# Patient Record
Sex: Male | Born: 1990 | Race: Black or African American | Hispanic: No | Marital: Single | Smoking: Never smoker
Health system: Southern US, Community
[De-identification: ages and names within clinical notes are randomized; demographics above are authoritative.]

---
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
## 2010-07-14 LAB — CULTURE, ROUTINE-ABSCESS

## 2013-06-09 ENCOUNTER — Emergency Department (HOSPITAL_COMMUNITY)
Admission: EM | Admit: 2013-06-09 | Discharge: 2013-06-09 | Disposition: A | Discharge status: Home / Self Care | Payer: PRIVATE HEALTH INSURANCE | Attending: Emergency Medicine | Admitting: Emergency Medicine

## 2013-06-09 ENCOUNTER — Encounter (HOSPITAL_COMMUNITY): Payer: Self-pay | Admitting: Emergency Medicine

## 2013-06-09 DIAGNOSIS — F17200 Nicotine dependence, unspecified, uncomplicated: Secondary | ICD-10-CM | POA: Insufficient documentation

## 2013-06-09 DIAGNOSIS — K0889 Other specified disorders of teeth and supporting structures: Secondary | ICD-10-CM

## 2013-06-09 DIAGNOSIS — K089 Disorder of teeth and supporting structures, unspecified: Secondary | ICD-10-CM | POA: Insufficient documentation

## 2013-06-09 DIAGNOSIS — K029 Dental caries, unspecified: Secondary | ICD-10-CM | POA: Insufficient documentation

## 2013-06-09 DIAGNOSIS — K002 Abnormalities of size and form of teeth: Secondary | ICD-10-CM | POA: Insufficient documentation

## 2013-06-09 MED ORDER — HYDROCODONE-ACETAMINOPHEN 5-325 MG PO TABS: ORAL_TABLET | ORAL | Status: DC

## 2013-06-09 MED ORDER — NAPROXEN 500 MG PO TABS: 500.0000 mg | ORAL_TABLET | Freq: Two times a day (BID) | ORAL | Status: DC

## 2013-06-09 MED ORDER — HYDROCODONE-ACETAMINOPHEN 5-325 MG PO TABS
1.0000 | ORAL_TABLET | Freq: Once | ORAL | Status: AC
  Administered 2013-06-09: 1 via ORAL
  Filled 2013-06-09: qty 1

## 2013-06-09 NOTE — Discharge Instructions (Signed)
Please read and follow all provided instructions.  Your diagnoses today include:  1. Pain, dental     The exam and treatment you received today has been provided on an emergency basis only. This is not a substitute for complete medical or dental care.  Tests performed today include:  Vital signs. See below for your results today.   Medications prescribed:   Vicodin (hydrocodone/acetaminophen) - narcotic pain medication  DO NOT drive or perform any activities that require you to be awake and alert because this medicine can make you drowsy. BE VERY CAREFUL not to take multiple medicines containing Tylenol (also called acetaminophen). Doing so can lead to an overdose which can damage your liver and cause liver failure and possibly death.   Naproxen - anti-inflammatory pain medication  Do not exceed 500mg  naproxen every 12 hours, take with food  You have been prescribed an anti-inflammatory medication or NSAID. Take with food. Take smallest effective dose for the shortest duration needed for your pain. Stop taking if you experience stomach pain or vomiting.   Take any prescribed medications only as directed.  Home care instructions:  Follow any educational materials contained in this packet.  Follow-up instructions: Please follow-up with your dentist for further evaluation of your symptoms. If you do not have a dentist or primary care doctor -- see below for referral information.   Dental Assistance: See below for dental referrals  Return instructions:   Please return to the Emergency Department if you experience worsening symptoms.  Please return if you develop a fever, you develop more swelling in your face or neck, you have trouble breathing or swallowing food.  Please return if you have any other emergent concerns.  Additional Information:  Your vital signs today were: BP 152/80   Pulse 94   Temp(Src) 97.8 F (36.6 C) (Oral)   Resp 18   SpO2 100% If your blood pressure  (BP) was elevated above 135/85 this visit, please have this repeated by your doctor within one month. -------------- Dental Care: Organization         Address  Phone  Notes  [facility] [address], [place] [phone] Accepts children up to age 23 who are enrolled in [place] or [place] Health Choice; pregnant women with a Medicaid card; and children who have applied for Medicaid or [place] Health Choice, but were declined, whose parents can pay a reduced fee at time of service.  [facility] [place]  [address], [place] [phone] Accepts children up to age 23 who are enrolled in [place] or [place] Health Choice; pregnant women with a Medicaid card; and children who have applied for Medicaid or [place] Health Choice, but were declined, whose parents can pay a reduced fee at time of service.  [facility]  [address], [place] [phone] Patients are seen by appointment only. Walk-ins are not accepted. [facility] will see patients 23 years of age and older. Monday - Tuesday (8am-5pm) Most Wednesdays (8:30-5pm) $30 per visit, cash only  [facility]  [address], [facility] [phone] Patients are seen by appointment only. Walk-ins are not accepted. [facility] will see patients 23 years of age and older. One Wednesday Evening (Monthly: Volunteer Based).  $30 per visit, cash only  [facility]  [phone] for adults; Children under age 544, call  Graduate Pediatric Dentistry at [phone]. Children aged 19-14, please call [phone] to request a pediatric application.  Dental services are provided in all areas of dental care including fillings, crowns and bridges, complete and partial dentures, implants, gum treatment, root canals, and extractions. Preventive care is also provided. Treatment  is provided to both adults and children. Patients are selected via a lottery and there is often a waiting list.   [facility] [address], [place]  [phone] [email or web address]   [facility] [address] [place], [place] [phone], Ext. [phone] Second and Fourth Thursday of each month, opens at 6:30 AM; Clinic ends at 9 AM.  Patients are seen on a first-come first-served basis, and a limited number are seen during each clinic.   [facility]  [address] [place], [place] [phone]   Eligibility Requirements You must have lived in [place], [place], or [place] counties for at least the last three months.   You cannot be eligible for state or federal sponsored [place], including [employer], [place], or [employer].   You generally cannot be eligible for healthcare insurance through your employer.    How to apply: Eligibility screenings are held every Tuesday and Wednesday afternoon from 1:00 pm until 4:00 pm. You do not need an appointment for the interview!  [facility] [address], [place], [place] [phone]   [facility]  [phone]   [facility] Department  [phone]   [facility]  [phone]

## 2013-06-09 NOTE — ED Provider Notes (Signed)
CSN: [id number]     Arrival date & time 06/09/13  [id number] History  This chart was scribed for non-physician practitioner, Renne CriglerJoshua Izora Benn, PA-C, working with Shelda JakesScott W. Zackowski, MD by Shari HeritageAisha Amuda, ED Scribe. This patient was seen in room TR05C/TR05C and the patient's care was started at 10:04 AM.    Chief Complaint  Patient presents with  . Dental Pain    The history is provided by the patient. No language interpreter was used.    HPI Comments: Jose FlamingKarlo D Mendez is a 23 y.o. male who presents to the Emergency Department complaining of severe constant left lower dental pain onset 8 hours ago. Patient states that pain woke him from sleep. He has seen a dentist for this problem and states that he is supposed to have a root canal.  He has taken Tramadol without relief. He denies facial swelling, dysphagia, fever, nausea or vomiting. Patient has no chronic medical conditions. He is a current every day smoker.  History reviewed. No pertinent past medical history. History reviewed. No pertinent past surgical history. History reviewed. No pertinent family history. History  Substance Use Topics  . Smoking status: Current Every Day Smoker  . Smokeless tobacco: Not on file  . Alcohol Use: No    Review of Systems  Constitutional: Negative for fever.  HENT: Positive for dental problem. Negative for ear pain, facial swelling, sore throat and trouble swallowing.   Respiratory: Negative for shortness of breath and stridor.   Gastrointestinal: Negative for nausea and vomiting.  Musculoskeletal: Negative for neck pain.  Skin: Negative for color change.  Neurological: Negative for headaches.      Allergies  Review of patient's allergies indicates no known allergies.  Home Medications  No current outpatient prescriptions on file. Triage Vitals: BP 152/80  Pulse 94  Temp(Src) 97.8 F (36.6 C) (Oral)  Resp 18  SpO2 100% Physical Exam  Nursing note and vitals reviewed. Constitutional: He appears  well-developed and well-nourished. No distress.  HENT:  Head: Normocephalic and atraumatic.  Right Ear: Tympanic membrane, external ear and ear canal normal.  Left Ear: Tympanic membrane, external ear and ear canal normal.  Nose: Nose normal.  Mouth/Throat: Uvula is midline, oropharynx is clear and moist and mucous membranes are normal. No trismus in the jaw. Abnormal dentition. Dental caries present. No dental abscesses or uvula swelling. No tonsillar abscesses.  Tenderness to percussion of 1st left lower molar. Scattered caries. Gingiva normal.  No facial swelling appreciated. No gumline swelling.  Eyes: EOM are normal. Pupils are equal, round, and reactive to light.  Neck: Normal range of motion. Neck supple. No tracheal deviation present.  No neck swelling or Lugwig's angina  Cardiovascular: Normal rate.   Pulmonary/Chest: Effort normal. No respiratory distress.  Musculoskeletal: Normal range of motion.  Neurological: He is alert.  Skin: Skin is warm and dry.  Psychiatric: He has a normal mood and affect. His behavior is normal.    ED Course  Procedures (including critical care time) DIAGNOSTIC STUDIES: Oxygen Saturation is 100% on room air, normal by my interpretation.    COORDINATION OF CARE: 10:10 AM- Patient informed of current plan for treatment and evaluation and agrees with plan at this time.   Patient seen and examined. Medications ordered.   Vital signs reviewed and are as follows: Filed Vitals:   06/09/13 0958  BP: 152/80  Pulse: 94  Temp: 97.8 F (36.6 C)  Resp: 18   Patient counseled to take prescribed medications as directed, return with worsening  facial or neck swelling, and to follow-up with their dentist as soon as possible.   Patient counseled on use of narcotic pain medications. Counseled not to combine these medications with others containing tylenol. Urged not to drink alcohol, drive, or perform any other activities that requires focus while taking  these medications. The patient verbalizes understanding and agrees with the plan.    MDM   Final diagnoses:  Pain, dental   Patient with toothache. No fever. Exam unconcerning for Ludwig's angina or other deep tissue infection in neck.   As there is no facial swelling or gum findings, will not prescribe antibiotics at this time. Will treat with pain medication.     I personally performed the services described in this documentation, which was scribed in my presence. The recorded information has been reviewed and is accurate.    [name], PA-C 06/09/13 1030

## 2013-06-09 NOTE — ED Notes (Signed)
Pt c/o left lower dental pain x several weeks that is worse x 2 days

## 2013-06-13 NOTE — ED Provider Notes (Signed)
Medical screening examination/treatment/procedure(s) were performed by non-physician practitioner and as supervising physician I was immediately available for consultation/collaboration.  EKG Interpretation   None         Shelda JakesScott W. Cova Knieriem, MD 06/13/13 [phone]

## 2013-08-07 ENCOUNTER — Emergency Department (HOSPITAL_COMMUNITY)
Admission: EM | Admit: 2013-08-07 | Discharge: 2013-08-07 | Disposition: A | Discharge status: Home / Self Care | Payer: PRIVATE HEALTH INSURANCE | Attending: Emergency Medicine | Admitting: Emergency Medicine

## 2013-08-07 ENCOUNTER — Encounter (HOSPITAL_COMMUNITY): Payer: Self-pay | Admitting: Emergency Medicine

## 2013-08-07 DIAGNOSIS — Y939 Activity, unspecified: Secondary | ICD-10-CM | POA: Insufficient documentation

## 2013-08-07 DIAGNOSIS — Y929 Unspecified place or not applicable: Secondary | ICD-10-CM | POA: Insufficient documentation

## 2013-08-07 DIAGNOSIS — W540XXA Bitten by dog, initial encounter: Secondary | ICD-10-CM | POA: Insufficient documentation

## 2013-08-07 DIAGNOSIS — F17200 Nicotine dependence, unspecified, uncomplicated: Secondary | ICD-10-CM | POA: Insufficient documentation

## 2013-08-07 DIAGNOSIS — L089 Local infection of the skin and subcutaneous tissue, unspecified: Secondary | ICD-10-CM

## 2013-08-07 DIAGNOSIS — Z791 Long term (current) use of non-steroidal anti-inflammatories (NSAID): Secondary | ICD-10-CM | POA: Insufficient documentation

## 2013-08-07 DIAGNOSIS — Z23 Encounter for immunization: Secondary | ICD-10-CM | POA: Insufficient documentation

## 2013-08-07 DIAGNOSIS — T148XXA Other injury of unspecified body region, initial encounter: Secondary | ICD-10-CM

## 2013-08-07 DIAGNOSIS — S61509A Unspecified open wound of unspecified wrist, initial encounter: Secondary | ICD-10-CM | POA: Insufficient documentation

## 2013-08-07 LAB — CBC WITH DIFFERENTIAL/PLATELET
BASOS ABS: 0 10*3/uL (ref 0.0–0.1)
BASOS PCT: 0 % (ref 0–1)
EOS ABS: 0 10*3/uL (ref 0.0–0.7)
EOS PCT: 0 % (ref 0–5)
HCT: 46 % (ref 39.0–52.0)
Hemoglobin: 15.6 g/dL (ref 13.0–17.0)
LYMPHS ABS: 2.8 10*3/uL (ref 0.7–4.0)
Lymphocytes Relative: 22 % (ref 12–46)
MCH: 28.8 pg (ref 26.0–34.0)
MCHC: 33.9 g/dL (ref 30.0–36.0)
MCV: 84.9 fL (ref 78.0–100.0)
Monocytes Absolute: 0.6 10*3/uL (ref 0.1–1.0)
Monocytes Relative: 4 % (ref 3–12)
NEUTROS PCT: 74 % (ref 43–77)
Neutro Abs: 9.3 10*3/uL — ABNORMAL HIGH (ref 1.7–7.7)
PLATELETS: 184 10*3/uL (ref 150–400)
RBC: 5.42 MIL/uL (ref 4.22–5.81)
RDW: 13.3 % (ref 11.5–15.5)
WBC: 12.7 10*3/uL — AB (ref 4.0–10.5)

## 2013-08-07 MED ORDER — AMOXICILLIN-POT CLAVULANATE 875-125 MG PO TABS: 1.0000 | ORAL_TABLET | Freq: Two times a day (BID) | ORAL | Status: DC

## 2013-08-07 MED ORDER — TETANUS-DIPHTH-ACELL PERTUSSIS 5-2.5-18.5 LF-MCG/0.5 IM SUSP
0.5000 mL | Freq: Once | INTRAMUSCULAR | Status: AC
  Administered 2013-08-07: 0.5 mL via INTRAMUSCULAR
  Filled 2013-08-07: qty 0.5

## 2013-08-07 MED ORDER — SODIUM CHLORIDE 0.9 % IV SOLN: 3.0000 g | Freq: Once | INTRAVENOUS | Status: DC

## 2013-08-07 MED ORDER — RABIES IMMUNE GLOBULIN 150 UNIT/ML IM INJ
20.0000 [IU]/kg | INJECTION | Freq: Once | INTRAMUSCULAR | Status: AC
  Administered 2013-08-07: 1725 [IU] via INTRAMUSCULAR
  Filled 2013-08-07: qty 11.5

## 2013-08-07 MED ORDER — ONDANSETRON 4 MG PO TBDP
8.0000 mg | ORAL_TABLET | Freq: Once | ORAL | Status: AC
  Administered 2013-08-07: 8 mg via ORAL
  Filled 2013-08-07: qty 2

## 2013-08-07 MED ORDER — OXYCODONE-ACETAMINOPHEN 5-325 MG PO TABS
1.0000 | ORAL_TABLET | Freq: Once | ORAL | Status: AC
  Administered 2013-08-07: 1 via ORAL
  Filled 2013-08-07: qty 1

## 2013-08-07 MED ORDER — AMOXICILLIN-POT CLAVULANATE 875-125 MG PO TABS
1.0000 | ORAL_TABLET | Freq: Once | ORAL | Status: AC
  Administered 2013-08-07: 1 via ORAL
  Filled 2013-08-07: qty 1

## 2013-08-07 MED ORDER — RABIES VACCINE, PCEC IM SUSR
1.0000 mL | Freq: Once | INTRAMUSCULAR | Status: AC
Start: 2013-08-07 — End: 2013-08-07
  Administered 2013-08-07: 1 mL via INTRAMUSCULAR
  Filled 2013-08-07: qty 1

## 2013-08-07 NOTE — Consult Note (Signed)
Reason for Consult:right wrist volar dogbite Referring Physician: Bynum BellowsAllen  Jose Mendez is an 23 y.o. male.  HPI: s/p dogbite 48 hours ago  History reviewed. No pertinent past medical history.  History reviewed. No pertinent past surgical history.  No family history on file.  Social History:  reports that he has been smoking.  He does not have any smokeless tobacco history on file. He reports that he does not drink alcohol or use illicit drugs.  Allergies: No Known Allergies  Medications:  Scheduled: . rabies immune globulin  20 Units/kg Intramuscular Once  . rabies vaccine  1 mL Intramuscular Once  . Tdap  0.5 mL Intramuscular Once    Results for orders placed during the hospital encounter of 08/07/13 (from the past 48 hour(s))  CBC WITH DIFFERENTIAL     Status: Abnormal   Collection Time    08/07/13  1:04 PM      Result Value Ref Range   WBC 12.7 (*) 4.0 - 10.5 K/uL   RBC 5.42  4.22 - 5.81 MIL/uL   Hemoglobin 15.6  13.0 - 17.0 g/dL   HCT 16.146.0  09.639.0 - 04.552.0 %   MCV 84.9  78.0 - 100.0 fL   MCH 28.8  26.0 - 34.0 pg   MCHC 33.9  30.0 - 36.0 g/dL   RDW 40.913.3  81.111.5 - 91.415.5 %   Platelets 184  150 - 400 K/uL   Neutrophils Relative % 74  43 - 77 %   Neutro Abs 9.3 (*) 1.7 - 7.7 K/uL   Lymphocytes Relative 22  12 - 46 %   Lymphs Abs 2.8  0.7 - 4.0 K/uL   Monocytes Relative 4  3 - 12 %   Monocytes Absolute 0.6  0.1 - 1.0 K/uL   Eosinophils Relative 0  0 - 5 %   Eosinophils Absolute 0.0  0.0 - 0.7 K/uL   Basophils Relative 0  0 - 1 %   Basophils Absolute 0.0  0.0 - 0.1 K/uL    No results found.  Review of Systems  All other systems reviewed and are negative.  Blood pressure 101/36, pulse 70, temperature 98.3 F (36.8 C), temperature source Oral, resp. rate 18, weight 87.544 kg (193 lb), SpO2 97.00%. Physical Exam  Constitutional: He is oriented to person, place, and time. He appears well-developed and well-nourished.  HENT:  Head: Normocephalic and atraumatic.   Cardiovascular: Normal rate.   Respiratory: Effort normal.  Musculoskeletal:       Right wrist: He exhibits tenderness and swelling.  Small midline volar abscess with drainage  kanavels negative  No sign of ascending infection  Neurological: He is alert and oriented to person, place, and time.  Skin: Skin is warm.  Psychiatric: He has a normal mood and affect. His behavior is normal. Judgment and thought content normal.    Assessment/Plan: As above  Patient has spontaneous drainage here in ER  Would start PO augmentin 875 BID and warm soapy soaks TID for 20 minutes  Will see in my office tommorrrow  Marlowe ShoresMatthew A Anjolaoluwa Siguenza 08/07/2013, 2:50 PM

## 2013-08-07 NOTE — ED Notes (Signed)
Pt bit by a unknown pit bull to right wrist. Pt presents with abscess to anterior right wrist and scratch to posterior right wrist. Last Tetanus unknown.

## 2013-08-07 NOTE — ED Notes (Signed)
Call to triage no response

## 2013-08-07 NOTE — ED Provider Notes (Signed)
CSN: [id number]     Arrival date & time 08/07/13  1037 History   HPI Comments: Jose Mendez is a 23 y.o. male who presents to the Emergency Department with a chief complaint of an animal bite to the R wrist. 2 days ago.  He reports it was a neighborhood dog unknown vaccination history. The patient reports increased pain with flexion of the wrist. He is unsure about his last Td vaccination was. He reports increase pain with flexion and extension of the wrist.  The history is provided by the patient. No language interpreter was used.    History reviewed. No pertinent past medical history. History reviewed. No pertinent past surgical history. No family history on file. History  Substance Use Topics  . Smoking status: Current Every Day Smoker  . Smokeless tobacco: Not on file  . Alcohol Use: No    Review of Systems  Constitutional: Negative for fever, chills and fatigue.  Respiratory: Negative for shortness of breath.   Gastrointestinal: Negative for nausea and vomiting.  Musculoskeletal: Positive for arthralgias and joint swelling.  Skin: Positive for color change and wound.  Neurological: Negative for weakness and numbness.  All other systems reviewed and are negative.  Allergies  Review of patient's allergies indicates no known allergies.  Home Medications   Prior to Admission medications   Medication Sig Start Date End Date Taking? Authorizing Provider  amoxicillin (AMOXIL) 500 MG capsule Take 500 mg by mouth 4 (four) times daily. Started on 2/2 for unknown length    Historical Provider, MD  HYDROcodone-acetaminophen (NORCO/VICODIN) 5-325 MG per tablet Take 1-2 tablets every 6 hours as needed for severe pain 06/09/13   Renne CriglerJoshua Geiple, PA-C  naproxen (NAPROSYN) 500 MG tablet Take 1 tablet (500 mg total) by mouth 2 (two) times daily. 06/09/13   Renne CriglerJoshua Geiple, PA-C  traMADol (ULTRAM) 50 MG tablet Take 50 mg by mouth every 6 (six) hours as needed for moderate pain.    Historical  Provider, MD   Triage Vitals: BP 101/36  Pulse 70  Temp(Src) 98.3 F (36.8 C) (Oral)  Resp 18  Wt 193 lb (87.544 kg)  SpO2 97%  Physical Exam  Nursing note and vitals reviewed. Constitutional: He is oriented to person, place, and time. He appears well-developed and well-nourished. No distress.  HENT:  Head: Normocephalic and atraumatic.  Eyes: EOM are normal.  Neck: Neck supple. No tracheal deviation present.  Cardiovascular: Normal rate.   Pulmonary/Chest: Effort normal. No respiratory distress.  Musculoskeletal: Normal range of motion.  0.5 x 0.5 cm pustular wound to the volar aspect of the right forearm.  Surrounding erythema.  Discomfort with flexion and extension of the wrist.  Superficial abrasion to medial forearm, no drainage or surrounding erythema.   Neurological: He is alert and oriented to person, place, and time.  Skin: Skin is warm and dry. There is erythema.  Psychiatric: He has a normal mood and affect. His behavior is normal.    ED Course  Procedures (including critical care time)  COORDINATION OF CARE: 2:22 PM-Consulted with Dr. Ronie SpiesWeingold's PA who will evaluate patient in the ED. Treatment plan discussed with patient and patient agrees.   Labs Review Labs Reviewed  CBC WITH DIFFERENTIAL - Abnormal; Notable for the following:    WBC 12.7 (*)    Neutro Abs 9.3 (*)    All other components within normal limits  WOUND CULTURE   Imaging Review No results found.   EKG Interpretation None  MDM   Final diagnoses:  Wound infection  Dog bite   Pt bit by dog 2 days ago, pustular puncture wound to wrist.  Discussed with Dr. Freida BusmanAllen advises possible early tendosynovitis.  While in the ED the pt opened the wound and drained it, himself.  Wound culutre, cbc ordered. Rabies shots Td given. Discussed with Dr. Mina MarbleWeingold who evaluated in the ED.    Dr. Mina MarbleWeingold evaluated the patient in the emergency department.  He advises warm soaks with soap. Unasyn 3 g.  Antibiotics with followup as an outpatient. Patient refused IV antibiotics, discussed the importance of treating infection. Reports he will follow up with Dr. Mina MarbleWeingold as outpatient and take his oral antibiotics. Discussed treatment plan with the patient, return for his next round of rabies shots. Return precautions given. Reports understanding and no other concerns at this time.  Patient is stable for discharge at this time.  Meds given in ED:  Medications  oxyCODONE-acetaminophen (PERCOCET/ROXICET) 5-325 MG per tablet 1 tablet (1 tablet Oral Given 08/07/13 1111)  ondansetron (ZOFRAN-ODT) disintegrating tablet 8 mg (8 mg Oral Given 08/07/13 1113)  Tdap (BOOSTRIX) injection 0.5 mL (0.5 mLs Intramuscular Given 08/07/13 1538)  rabies vaccine (RABAVERT) injection 1 mL (1 mL Intramuscular Given 08/07/13 1531)  rabies immune globulin (HYPERAB) injection 1,725 Units (1,725 Units Intramuscular Given 08/07/13 1530)  amoxicillin-clavulanate (AUGMENTIN) 875-125 MG per tablet 1 tablet (1 tablet Oral Given 08/07/13 1544)    Discharge Medication List as of 08/07/2013  3:44 PM    START taking these medications   Details  amoxicillin-clavulanate (AUGMENTIN) 875-125 MG per tablet Take 1 tablet by mouth 2 (two) times daily., Starting 08/07/2013, Until Discontinued, Print         [name], PA-C 08/09/13 1744

## 2013-08-07 NOTE — Discharge Instructions (Signed)
Call Dr. Mina MarbleWeingold for a follow up evaluation. Return to the Emergency Department in 3 days for more Rabies Vaccination shots. Call for a follow up appointment with a Family or Primary Care Provider.  Return if Symptoms worsen.   Take medication as prescribed.  Soak in a warm water with soap for 20 minutes as instructed by Dr. Mina MarbleWeingold.

## 2013-08-09 ENCOUNTER — Telehealth (HOSPITAL_BASED_OUTPATIENT_CLINIC_OR_DEPARTMENT_OTHER): Payer: Self-pay

## 2013-08-09 LAB — WOUND CULTURE: Special Requests: NORMAL

## 2013-08-09 NOTE — Telephone Encounter (Signed)
[facility].  Rx for Augmentin -> Resistant to the same.  Called Dr Ronie SpiesWeingold's office with whom pt was to have f/u appt and was told pt was a "no show".  Chart reviewed by Mckinley JewelKaitlyn S. PA "Bactrim DS 160/800 Take 1 tab po BID # 20, no RF"  08/09/2013 @ 1347 LVM requesting callback.

## 2013-08-11 NOTE — ED Provider Notes (Signed)
Medical screening examination/treatment/procedure(s) were performed by non-physician practitioner and as supervising physician I was immediately available for consultation/collaboration.   EKG Interpretation None       Maliha Outten T Pamala Hayman, MD 08/11/13 1419 

## 2013-08-16 ENCOUNTER — Telehealth (HOSPITAL_BASED_OUTPATIENT_CLINIC_OR_DEPARTMENT_OTHER): Payer: Self-pay

## 2013-08-16 NOTE — Telephone Encounter (Signed)
LVM requesting callback.  Unable to reach x 4. [facility]

## 2013-10-12 ENCOUNTER — Telehealth (HOSPITAL_BASED_OUTPATIENT_CLINIC_OR_DEPARTMENT_OTHER): Payer: Self-pay | Admitting: Emergency Medicine

## 2014-08-10 ENCOUNTER — Encounter (HOSPITAL_COMMUNITY): Payer: Self-pay | Admitting: Emergency Medicine

## 2014-08-10 ENCOUNTER — Emergency Department (HOSPITAL_COMMUNITY)
Admission: EM | Admit: 2014-08-10 | Discharge: 2014-08-10 | Disposition: A | Discharge status: Home / Self Care | Payer: PRIVATE HEALTH INSURANCE | Attending: Emergency Medicine | Admitting: Emergency Medicine

## 2014-08-10 DIAGNOSIS — Y9289 Other specified places as the place of occurrence of the external cause: Secondary | ICD-10-CM | POA: Insufficient documentation

## 2014-08-10 DIAGNOSIS — Z792 Long term (current) use of antibiotics: Secondary | ICD-10-CM | POA: Insufficient documentation

## 2014-08-10 DIAGNOSIS — Z720 Tobacco use: Secondary | ICD-10-CM | POA: Insufficient documentation

## 2014-08-10 DIAGNOSIS — Y9389 Activity, other specified: Secondary | ICD-10-CM | POA: Insufficient documentation

## 2014-08-10 DIAGNOSIS — S61203A Unspecified open wound of left middle finger without damage to nail, initial encounter: Secondary | ICD-10-CM | POA: Insufficient documentation

## 2014-08-10 DIAGNOSIS — Z791 Long term (current) use of non-steroidal anti-inflammatories (NSAID): Secondary | ICD-10-CM | POA: Insufficient documentation

## 2014-08-10 DIAGNOSIS — W452XXA Lid of can entering through skin, initial encounter: Secondary | ICD-10-CM | POA: Insufficient documentation

## 2014-08-10 DIAGNOSIS — T148XXA Other injury of unspecified body region, initial encounter: Secondary | ICD-10-CM

## 2014-08-10 DIAGNOSIS — Y998 Other external cause status: Secondary | ICD-10-CM | POA: Insufficient documentation

## 2014-08-10 MED ORDER — IBUPROFEN 800 MG PO TABS: 800.0000 mg | ORAL_TABLET | Freq: Three times a day (TID) | ORAL | Status: DC | PRN

## 2014-08-10 NOTE — ED Notes (Signed)
Pt. presents with laceration at left distal middle finger sustained this evening while opening a can , no bleeding / dressing applied prior to arrival .

## 2014-08-10 NOTE — ED Provider Notes (Signed)
CSN: [id number]     Arrival date & time 08/10/14  1945 History  This chart was scribed for non-physician practitioner, Ebbie Ridgehris Levoy Geisen, PA-C,working with Rolland PorterMark James, MD, by Karle PlumberJennifer Tensley, ED Scribe. This patient was seen in room TR03C/TR03C and the patient's care was started at 8:57 PM.  Chief Complaint  Patient presents with  . Finger Injury   The history is provided by the patient and medical records. No language interpreter was used.    HPI Comments:  Jose Mendez is a 24 y.o. male who presents to the Emergency Department complaining of a laceration to the third digit of the left hand that occurred approximately 1.5 hours ago. He states he was opening a tin can of food when the lid sliced into his finger. He reports associated bleeding that is now controlled. He has wrapped the finger to control the bleeding. Denies modifying factors of the pain or bleeding. Denies numbness, tingling or weakness of the digit or left hand, redness or warmth. Pt reports that his tetanus vaccination is UTD.  History reviewed. No pertinent past medical history. History reviewed. No pertinent past surgical history. No family history on file. History  Substance Use Topics  . Smoking status: Current Every Day Smoker  . Smokeless tobacco: Not on file  . Alcohol Use: No    Review of Systems All other systems negative except as documented in the HPI. All pertinent positives and negatives as reviewed in the HPI.  Allergies  Review of patient's allergies indicates no known allergies.  Home Medications   Prior to Admission medications   Medication Sig Start Date End Date Taking? Authorizing Provider  amoxicillin (AMOXIL) 500 MG capsule Take 500 mg by mouth 4 (four) times daily. Started on 2/2 for unknown length    Historical Provider, MD  amoxicillin-clavulanate (AUGMENTIN) 875-125 MG per tablet Take 1 tablet by mouth 2 (two) times daily. 08/07/13   Mellody DrownLauren Parker, PA-C  HYDROcodone-acetaminophen  (NORCO/VICODIN) 5-325 MG per tablet Take 1-2 tablets every 6 hours as needed for severe pain 06/09/13   Renne CriglerJoshua Geiple, PA-C  naproxen (NAPROSYN) 500 MG tablet Take 1 tablet (500 mg total) by mouth 2 (two) times daily. 06/09/13   Renne CriglerJoshua Geiple, PA-C  traMADol (ULTRAM) 50 MG tablet Take 50 mg by mouth every 6 (six) hours as needed for moderate pain.    Historical Provider, MD   Triage Vitals: BP 119/77 mmHg  Pulse 80  Temp(Src) 98.1 F (36.7 C) (Oral)  Resp 20  SpO2 100% Physical Exam  Constitutional: He is oriented to person, place, and time. He appears well-developed and well-nourished.  HENT:  Head: Normocephalic and atraumatic.  Eyes: EOM are normal.  Neck: Normal range of motion.  Cardiovascular: Normal rate.   Pulmonary/Chest: Effort normal.  Musculoskeletal: Normal range of motion.  Good ROM of third left digit.  Neurological: He is alert and oriented to person, place, and time.  Distal sensations intact of third left digit.  Skin: Skin is warm and dry.  Skin avulsion to the lateral third digit of left hand just lateral to the nail.  Psychiatric: He has a normal mood and affect. His behavior is normal.  Nursing note and vitals reviewed.   ED Course  Procedures (including critical care time) DIAGNOSTIC STUDIES: Oxygen Saturation is 100% on RA, normal by my interpretation.   COORDINATION OF CARE: 9:00 PM- Will clean and dress wound. Advised pt that sutures were not necessary for the wound. Pt verbalizes understanding and agrees to plan.  Patient has an avulsion to the skin of the finger where he was cut. There is no area to sutures  I personally performed the services described in this documentation, which was scribed in my presence. The recorded information has been reviewed and is accurate.    Charlestine Night, PA-C 08/10/14 2204  Rolland Porter, MD 08/21/14 [phone]

## 2014-08-10 NOTE — ED Notes (Signed)
Patient is alert and orientedx4.  Patient was explained discharge instructions and they understood them with no questions.   

## 2014-08-10 NOTE — Discharge Instructions (Signed)
Return as needed. Keep the area clean and dry.

## 2019-01-10 ENCOUNTER — Encounter (HOSPITAL_COMMUNITY): Payer: Self-pay | Admitting: Emergency Medicine

## 2019-01-10 ENCOUNTER — Other Ambulatory Visit: Payer: Self-pay

## 2019-01-10 ENCOUNTER — Emergency Department (HOSPITAL_COMMUNITY)
Admission: EM | Admit: 2019-01-10 | Discharge: 2019-01-10 | Disposition: A | Discharge status: Home / Self Care | Payer: PRIVATE HEALTH INSURANCE | Attending: Emergency Medicine | Admitting: Emergency Medicine

## 2019-01-10 DIAGNOSIS — Z79899 Other long term (current) drug therapy: Secondary | ICD-10-CM | POA: Insufficient documentation

## 2019-01-10 DIAGNOSIS — J020 Streptococcal pharyngitis: Secondary | ICD-10-CM

## 2019-01-10 DIAGNOSIS — F17210 Nicotine dependence, cigarettes, uncomplicated: Secondary | ICD-10-CM | POA: Insufficient documentation

## 2019-01-10 LAB — GROUP A STREP BY PCR: Group A Strep by PCR: DETECTED — AB

## 2019-01-10 MED ORDER — AMOXICILLIN 500 MG PO CAPS: 500.0000 mg | ORAL_CAPSULE | Freq: Three times a day (TID) | ORAL | 0 refills | Status: DC

## 2019-01-10 MED ORDER — IBUPROFEN 800 MG PO TABS
800.0000 mg | ORAL_TABLET | Freq: Once | ORAL | Status: AC
  Administered 2019-01-10: 800 mg via ORAL
  Filled 2019-01-10: qty 1

## 2019-01-10 NOTE — ED Notes (Signed)
Pt verbalized understanding of d/c instructions and has no further questions, VSS, NAD. Pt sent home with prescription.

## 2019-01-10 NOTE — ED Provider Notes (Signed)
[facility] Provider Note   CSN: [id number] Arrival date & time: 01/10/19  1127     History   Chief Complaint Chief Complaint  Patient presents with  . Sore Throat    HPI [name] is a 28 y.o. male.     The history is provided by the patient. No language interpreter was used.  Sore Throat This is a new problem. The problem occurs constantly. The problem has been gradually worsening. Nothing aggravates the symptoms. Nothing relieves the symptoms. He has tried nothing for the symptoms. The treatment provided no relief.  Pt complains of a sore throat.  Pt denies any covid exposures.   History reviewed. No pertinent past medical history.  There are no active problems to display for this patient.   History reviewed. No pertinent surgical history.      Home Medications    Prior to Admission medications   Medication Sig Start Date End Date Taking? Authorizing Provider  amoxicillin (AMOXIL) 500 MG capsule Take 1 capsule (500 mg total) by mouth 3 (three) times daily. Started on 2/2 for unknown length 01/10/19   Fransico Meadow, PA-C  amoxicillin-clavulanate (AUGMENTIN) 875-125 MG per tablet Take 1 tablet by mouth 2 (two) times daily. 08/07/13   Harvie Heck, PA-C  HYDROcodone-acetaminophen (NORCO/VICODIN) 5-325 MG per tablet Take 1-2 tablets every 6 hours as needed for severe pain 06/09/13   Carlisle Cater, PA-C  ibuprofen (ADVIL,MOTRIN) 800 MG tablet Take 1 tablet (800 mg total) by mouth every 8 (eight) hours as needed. 08/10/14   Lawyer, Harrell Gave, PA-C  naproxen (NAPROSYN) 500 MG tablet Take 1 tablet (500 mg total) by mouth 2 (two) times daily. 06/09/13   Carlisle Cater, PA-C  traMADol (ULTRAM) 50 MG tablet Take 50 mg by mouth every 6 (six) hours as needed for moderate pain.    [provider]    Family History No family history on file.  Social History Social History   Tobacco Use  . Smoking status: Current Every  Day Smoker  . Smokeless tobacco: Never Used  Substance Use Topics  . Alcohol use: No  . Drug use: No     Allergies   Patient has no known allergies.   Review of Systems Review of Systems  HENT: Positive for sore throat.   Musculoskeletal: Positive for myalgias.  All other systems reviewed and are negative.    Physical Exam Updated Vital Signs BP (!) 129/93 (BP Location: Left Arm)   Pulse 99   Temp 98.8 F (37.1 C) (Oral)   Resp 16   Wt 87.5 kg   SpO2 96%   Physical Exam Vitals signs and nursing note reviewed.  Constitutional:      Appearance: He is well-developed.  HENT:     Head: Normocephalic and atraumatic.     Mouth/Throat:     Pharynx: Pharyngeal swelling and posterior oropharyngeal erythema present.  Eyes:     Conjunctiva/sclera: Conjunctivae normal.  Neck:     Musculoskeletal: Neck supple.  Cardiovascular:     Rate and Rhythm: Normal rate and regular rhythm.     Heart sounds: No murmur.  Pulmonary:     Effort: Pulmonary effort is normal. No respiratory distress.     Breath sounds: Normal breath sounds.  Abdominal:     Palpations: Abdomen is soft.     Tenderness: There is no abdominal tenderness.  Skin:    General: Skin is warm and dry.  Neurological:     Mental  Status: He is alert.      ED Treatments / Results  Labs (all labs ordered are listed, but only abnormal results are displayed) Labs Reviewed  GROUP A STREP BY PCR - Abnormal; Notable for the following components:      Result Value   Group A Strep by PCR DETECTED (*)    All other components within normal limits    EKG None  Radiology No results found.  Procedures Procedures (including critical care time)  Medications Ordered in ED Medications  ibuprofen (ADVIL) tablet 800 mg (has no administration in time range)     Initial Impression / Assessment and Plan / ED Course  I have reviewed the triage vital signs and the nursing notes.  Pertinent labs & imaging results that  were available during my care of the patient were reviewed by me and considered in my medical decision making (see chart for details).        MDM   Strep is positive.  Pt given rx for amoxicilian   Final Clinical Impressions(s) / ED Diagnoses   Final diagnoses:  Strep throat    ED Discharge Orders         Ordered    amoxicillin (AMOXIL) 500 MG capsule  3 times daily     01/10/19 1404        An After Visit Summary was printed and given to the patient.    Osie Cheeks 01/10/19 1428    Gerhard Munch, MD 01/11/19 [phone]

## 2019-01-10 NOTE — Discharge Instructions (Signed)
Return if any problems.

## 2019-01-10 NOTE — ED Triage Notes (Addendum)
Pt in with c/o sore throat and swollen neck lymphnodes and tonsils x 3 days. Denies fevers, has minor cough

## 2019-01-15 ENCOUNTER — Emergency Department (HOSPITAL_COMMUNITY)
Admission: EM | Admit: 2019-01-15 | Discharge: 2019-01-15 | Disposition: A | Discharge status: Home / Self Care | Payer: Self-pay | Attending: Emergency Medicine | Admitting: Emergency Medicine

## 2019-01-15 ENCOUNTER — Emergency Department (HOSPITAL_COMMUNITY): Payer: Self-pay

## 2019-01-15 ENCOUNTER — Other Ambulatory Visit: Payer: Self-pay

## 2019-01-15 DIAGNOSIS — F17200 Nicotine dependence, unspecified, uncomplicated: Secondary | ICD-10-CM | POA: Insufficient documentation

## 2019-01-15 DIAGNOSIS — J36 Peritonsillar abscess: Secondary | ICD-10-CM | POA: Insufficient documentation

## 2019-01-15 LAB — BASIC METABOLIC PANEL
Anion gap: 11 (ref 5–15)
BUN: 8 mg/dL (ref 6–20)
CO2: 24 mmol/L (ref 22–32)
Calcium: 9.1 mg/dL (ref 8.9–10.3)
Chloride: 107 mmol/L (ref 98–111)
Creatinine, Ser: 1.05 mg/dL (ref 0.61–1.24)
GFR calc Af Amer: >60 mL/min (ref >60)
GFR calc non Af Amer: >60 mL/min (ref >60)
Glucose, Bld: 103 mg/dL — ABNORMAL HIGH (ref 70–99)
Potassium: 3.9 mmol/L (ref 3.5–5.1)
Sodium: 142 mmol/L (ref 135–145)

## 2019-01-15 LAB — CBC WITH DIFFERENTIAL/PLATELET
Abs Immature Granulocytes: 0.22 10*3/uL — ABNORMAL HIGH (ref 0.00–0.07)
Basophils Absolute: 0.1 10*3/uL (ref 0.0–0.1)
Basophils Relative: 0 %
Eosinophils Absolute: 0.1 10*3/uL (ref 0.0–0.5)
Eosinophils Relative: 0 %
HCT: 43.9 % (ref 39.0–52.0)
Hemoglobin: 14.5 g/dL (ref 13.0–17.0)
Immature Granulocytes: 1 %
Lymphocytes Relative: 9 %
Lymphs Abs: 2.6 10*3/uL (ref 0.7–4.0)
MCH: 28.5 pg (ref 26.0–34.0)
MCHC: 33 g/dL (ref 30.0–36.0)
MCV: 86.2 fL (ref 80.0–100.0)
Monocytes Absolute: 1.3 10*3/uL — ABNORMAL HIGH (ref 0.1–1.0)
Monocytes Relative: 4 %
Neutro Abs: 25.5 10*3/uL — ABNORMAL HIGH (ref 1.7–7.7)
Neutrophils Relative %: 86 %
Platelets: 301 10*3/uL (ref 150–400)
RBC: 5.09 MIL/uL (ref 4.22–5.81)
RDW: 13.2 % (ref 11.5–15.5)
WBC: 29.8 10*3/uL — ABNORMAL HIGH (ref 4.0–10.5)
nRBC: 0 % (ref 0.0–0.2)

## 2019-01-15 MED ORDER — CLINDAMYCIN HCL 300 MG PO CAPS: 300.0000 mg | ORAL_CAPSULE | Freq: Three times a day (TID) | ORAL | 0 refills | Status: AC

## 2019-01-15 MED ORDER — KETOROLAC TROMETHAMINE 30 MG/ML IJ SOLN
30.0000 mg | Freq: Once | INTRAMUSCULAR | Status: AC
  Administered 2019-01-15: 10:00:00 30 mg via INTRAVENOUS
  Filled 2019-01-15: qty 1

## 2019-01-15 MED ORDER — MORPHINE SULFATE (PF) 4 MG/ML IV SOLN
4.0000 mg | Freq: Once | INTRAVENOUS | Status: AC
  Administered 2019-01-15: 4 mg via INTRAVENOUS
  Filled 2019-01-15: qty 1

## 2019-01-15 MED ORDER — HYDROCODONE-ACETAMINOPHEN 5-325 MG PO TABS: 1.0000 | ORAL_TABLET | ORAL | 0 refills | Status: DC | PRN

## 2019-01-15 MED ORDER — DEXAMETHASONE SODIUM PHOSPHATE 10 MG/ML IJ SOLN
10.0000 mg | Freq: Once | INTRAMUSCULAR | Status: AC
  Administered 2019-01-15: 10:00:00 10 mg via INTRAVENOUS
  Filled 2019-01-15: qty 1

## 2019-01-15 MED ORDER — SODIUM CHLORIDE 0.9 % IV BOLUS
1000.0000 mL | Freq: Once | INTRAVENOUS | Status: AC
  Administered 2019-01-15: 13:00:00 1000 mL via INTRAVENOUS

## 2019-01-15 MED ORDER — CLINDAMYCIN PHOSPHATE 600 MG/50ML IV SOLN
600.0000 mg | Freq: Once | INTRAVENOUS | Status: AC
  Administered 2019-01-15: 10:00:00 600 mg via INTRAVENOUS
  Filled 2019-01-15: qty 50

## 2019-01-15 MED ORDER — SODIUM CHLORIDE 0.9 % IV BOLUS
1000.0000 mL | Freq: Once | INTRAVENOUS | Status: AC
  Administered 2019-01-15: 10:00:00 1000 mL via INTRAVENOUS

## 2019-01-15 MED ORDER — IOHEXOL 300 MG/ML  SOLN
75.0000 mL | Freq: Once | INTRAMUSCULAR | Status: AC | PRN
  Administered 2019-01-15: 11:00:00 75 mL via INTRAVENOUS

## 2019-01-15 MED ORDER — NAPROXEN 500 MG PO TABS: 500.0000 mg | ORAL_TABLET | Freq: Two times a day (BID) | ORAL | 0 refills | Status: DC

## 2019-01-15 MED ORDER — LIDOCAINE VISCOUS HCL 2 % MT SOLN: 15.0000 mL | Freq: Once | OROMUCOSAL | Status: DC

## 2019-01-15 MED ORDER — ONDANSETRON HCL 4 MG/2ML IJ SOLN
4.0000 mg | Freq: Once | INTRAMUSCULAR | Status: AC
  Administered 2019-01-15: 4 mg via INTRAVENOUS
  Filled 2019-01-15: qty 2

## 2019-01-15 NOTE — Discharge Instructions (Addendum)
Make sure to push small slips of liquids.  You need to drink 3 L of fluids daily.  Take the antibiotics as prescribed.  If you are unable to tolerate a pill you may open the capsule and put it in something such as pudding or applesauce.  Take the pain medicine as prescribed.  Do not drive or operate heavy machinery with this.  May also take naproxen for inflammation.

## 2019-01-15 NOTE — ED Triage Notes (Signed)
Patient here with ongoing sore throat and took antibiotic for strep with no relief. Pain with swallowing

## 2019-01-15 NOTE — ED Provider Notes (Signed)
[place] EMERGENCY DEPARTMENT Provider Note   CSN: [id number] Arrival date & time: 01/15/19  6213    History   Chief Complaint Sore throat  HPI Jose Mendez is a 28 y.o. male with past medical history significant for strep throat, diagnosed 922/20 who presents for evaluation of sore throat.  Patient states he was given prescription for amoxicillin however is been intermittently taking this due to a rash that he gets.  Patient states he has had progressively worsening of his sore throat.  Patient states he is unable to tolerate solid or liquids due to pain.  States he is able to tolerate his secretions however has pain with this.  Rates his current pain a 9/10.  Pain worse with swallowing. Has prior history of PTA or RPA.  Denies additional aggravating or alleviating factors.  Denies fever, chills, nausea, vomiting, chest pain, shortness of breath, abdominal pain.  Patient states sore throat worse on the right.  Denies trismus however states he feels like he has to spit out his oral secretions.  History obtained from patient and past medical records.  No interpreter is used.     HPI  No past medical history on file.  There are no active problems to display for this patient.   No past surgical history on file.      Home Medications    Prior to Admission medications   Medication Sig Start Date End Date Taking? Authorizing Provider  amoxicillin (AMOXIL) 500 MG capsule Take 1 capsule (500 mg total) by mouth 3 (three) times daily. Started on 2/2 for unknown length 01/10/19   Fransico Meadow, PA-C  amoxicillin-clavulanate (AUGMENTIN) 875-125 MG per tablet Take 1 tablet by mouth 2 (two) times daily. 08/07/13   Harvie Heck, PA-C  clindamycin (CLEOCIN) 300 MG capsule Take 1 capsule (300 mg total) by mouth 3 (three) times daily for 7 days. 01/15/19 01/22/19  Kaniesha Barile A, PA-C  HYDROcodone-acetaminophen (NORCO/VICODIN) 5-325 MG tablet Take 1 tablet by mouth every  4 (four) hours as needed. 01/15/19   Jose Alleyne A, PA-C  ibuprofen (ADVIL,MOTRIN) 800 MG tablet Take 1 tablet (800 mg total) by mouth every 8 (eight) hours as needed. 08/10/14   Lawyer, Harrell Gave, PA-C  naproxen (NAPROSYN) 500 MG tablet Take 1 tablet (500 mg total) by mouth 2 (two) times daily. 01/15/19   Eriyonna Matsushita A, PA-C  traMADol (ULTRAM) 50 MG tablet Take 50 mg by mouth every 6 (six) hours as needed for moderate pain.    [provider]    Family History No family history on file.  Social History Social History   Tobacco Use   Smoking status: Current Every Day Smoker   Smokeless tobacco: Never Used  Substance Use Topics   Alcohol use: No   Drug use: No    Allergies   Patient has no known allergies.   Review of Systems Review of Systems  Constitutional: Negative.   HENT: Positive for sore throat. Negative for congestion, ear discharge, ear pain, facial swelling, postnasal drip, rhinorrhea, sinus pressure, sinus pain, trouble swallowing and voice change.   Eyes: Negative.   Respiratory: Negative.   Cardiovascular: Negative.   Gastrointestinal: Negative.   Genitourinary: Negative.   Musculoskeletal: Negative.   Skin: Negative.   Neurological: Negative.   All other systems reviewed and are negative.   Physical Exam Updated Vital Signs BP (!) 130/94 (BP Location: Right Arm)    Pulse 95    Temp 98.9 F (37.2 C) (  Oral)    Resp 18    SpO2 100%   Physical Exam Vitals signs and nursing note reviewed.  Constitutional:      General: He is not in acute distress.    Appearance: He is well-developed. He is not ill-appearing, toxic-appearing or diaphoretic.  HENT:     Head: Normocephalic and atraumatic.     Nose: Nose normal.     Mouth/Throat:     Comments: Erythematous posterior oropharynx. Mild pooling of oral secretions.  No drooling, dysphasia or trismus. Uvula midline without deviation.  Swelling to right posterior soft palate on right. Eyes:      Pupils: Pupils are equal, round, and reactive to light.  Neck:     Musculoskeletal: Full passive range of motion without pain, normal range of motion and neck supple.     Comments: No neck stiffness or neck rigidity. Cardiovascular:     Rate and Rhythm: Normal rate and regular rhythm.  Pulmonary:     Effort: Pulmonary effort is normal. No respiratory distress.  Abdominal:     General: There is no distension.     Palpations: Abdomen is soft.  Musculoskeletal: Normal range of motion.  Skin:    General: Skin is warm and dry.  Neurological:     Mental Status: He is alert.    ED Treatments / Results  Labs (all labs ordered are listed, but only abnormal results are displayed) Labs Reviewed  CBC WITH DIFFERENTIAL/PLATELET - Abnormal; Notable for the following components:      Result Value   WBC 29.8 (*)    Neutro Abs 25.5 (*)    Monocytes Absolute 1.3 (*)    Abs Immature Granulocytes 0.22 (*)    All other components within normal limits  BASIC METABOLIC PANEL - Abnormal; Notable for the following components:   Glucose, Bld 103 (*)    All other components within normal limits    EKG None  Radiology Ct Soft Tissue Neck W Contrast  Result Date: 01/15/2019 CLINICAL DATA:  Sore throat.  Rule out peritonsillar abscess. EXAM: CT NECK WITH CONTRAST TECHNIQUE: Multidetector CT imaging of the neck was performed using the standard protocol following the bolus administration of intravenous contrast. CONTRAST:  75mL OMNIPAQUE IOHEXOL 300 MG/ML  SOLN COMPARISON:  None. FINDINGS: Pharynx and larynx: Asymmetric enlargement of the right tonsil. Rim enhancing fluid collections in the right tonsil measuring 6 mm, 10 mm compatible with peritonsillar abscess. Edema extends into the right lateral pharyngeal wall down to the hypopharynx. Vocal cords normal. No airway compromise. Salivary glands: Edema around the right submandibular gland consistent with pharyngitis. No salivary gland mass or stone.  Thyroid: Negative Lymph nodes: Right level 2 lymph node 13 mm. Left level 2 lymph node 11 mm. Multiple small lymph nodes in the neck bilaterally. Vascular: Normal vascular enhancement. Limited intracranial: Negative Visualized orbits: Negative Mastoids and visualized paranasal sinuses: Negative Skeleton: Negative Upper chest: Lung apices clear bilaterally Other: None IMPRESSION: Right-sided pharyngitis with peritonsillar abscess. Two adjacent abscesses measuring 6 mm and 11 mm. Reactive adenopathy in the neck. Electronically Signed   By: Marlan Palauharles  Clark M.D.   On: 01/15/2019 11:46    Procedures Procedures (including critical care time)  Medications Ordered in ED Medications  lidocaine (XYLOCAINE) 2 % viscous mouth solution 15 mL (has no administration in time range)  clindamycin (CLEOCIN) IVPB 600 mg (0 mg Intravenous Stopped 01/15/19 1234)  sodium chloride 0.9 % bolus 1,000 mL (0 mLs Intravenous Stopped 01/15/19 1234)  ondansetron (ZOFRAN) injection  4 mg (4 mg Intravenous Given 01/15/19 1023)  ketorolac (TORADOL) 30 MG/ML injection 30 mg (30 mg Intravenous Given 01/15/19 1023)  dexamethasone (DECADRON) injection 10 mg (10 mg Intravenous Given 01/15/19 1024)  iohexol (OMNIPAQUE) 300 MG/ML solution 75 mL (75 mLs Intravenous Contrast Given 01/15/19 1106)  morphine 4 MG/ML injection 4 mg (4 mg Intravenous Given 01/15/19 1245)  sodium chloride 0.9 % bolus 1,000 mL (1,000 mLs Intravenous New Bag/Given 01/15/19 1247)   Initial Impression / Assessment and Plan / ED Course  I have reviewed the triage vital signs and the nursing notes.  Pertinent labs & imaging results that were available during my care of the patient were reviewed by me and considered in my medical decision making (see chart for details).  28 year old male appears otherwise well presents for evaluation of sore throat.  Afebrile, nonseptic, non-ill-appearing.  Diagnosed 5 days ago with strep pharyngitis however has not been taking his  antibiotics consistently.  Patient with worsening right-sided neck pain.  He does have erythematous posterior oropharynx with bulging to right superior soft palate. No exudate. No trismus.  No hot potato voice. Will start on antibiotics, provide labs, pain management, CT scan. Will likely need to consult ENT for possible PTA.  Tachycardia, tachypnea or hypoxia.  No evidence of sirs or sepsis.  Patient with white count of 29.8.  Metabolic panel without significant abnormality.  CT with asymmetric enlargement of the right tonsil. Rim enhancing fluid collections in the right tonsil measuring 6 mm, 10 mm compatible with peritonsillar abscess. Edema extends into the right lateral pharyngeal wall down to the hypopharynx. Vocal cords normal. No airway compromise.   Patient with mild improvement in symptoms however with continued pain.Tolerating secretions without trismus or dysphagia.  1200: Consulted with Dr. Annalee Genta with ENT.  Recommends additional IV fluids, pain management and outpatient follow-up in office.  Patient to get clindamycin capsules at home as well as pain management and force fluids to approximately 4 L of fluid daily at home. No evidence of spesis or sirs at this time.  1315: Patient reevaluated.  Pain improved.  No voice changes.  No drooling, dysphasia or trismus.  He is tolerating 2 ginger ale's without difficulty.  Will prescribe outpatient pain management, clindamycin, anti-inflammatories.  Discussed return precautions with patient.  Patient to follow-up outpatient with ENT.  1400: Patient patient requesting DC home.  He has no drooling, dysphasia or trismus.  No change in voice.  He is tolerating p.o. intake.  Will DC home with short course of pain medicine, antibiotics and have him follow-up with ENT.  Discussed return precautions.  Patient voiced understanding and is agreeable follow-up.  The patient has been appropriately medically screened and/or stabilized in the ED. I have low  suspicion for any other emergent medical condition which would require further screening, evaluation or treatment in the ED or require inpatient management.  Patient is hemodynamically stable and in no acute distress.  Patient able to ambulate in department prior to ED.  Evaluation does not show acute pathology that would require ongoing or additional emergent interventions while in the emergency department or further inpatient treatment.  I have discussed the diagnosis with the patient and answered all questions.  Pain is been managed while in the emergency department and patient has no further complaints prior to discharge.  Patient is comfortable with plan discussed in room and is stable for discharge at this time.  I have discussed strict return precautions for returning to the emergency department.  Patient was encouraged to follow-up with PCP/specialist refer to at discharge.        Final Clinical Impressions(s) / ED Diagnoses   Final diagnoses:  Peritonsillar abscess    ED Discharge Orders         Ordered    clindamycin (CLEOCIN) 300 MG capsule  3 times daily     01/15/19 1421    naproxen (NAPROSYN) 500 MG tablet  2 times daily     01/15/19 1421    HYDROcodone-acetaminophen (NORCO/VICODIN) 5-325 MG tablet  Every 4 hours PRN     01/15/19 1422           Leor Whyte A, PA-C 01/15/19 1428    Arby Barrette, MD 01/15/19 1442

## 2019-12-13 ENCOUNTER — Emergency Department (HOSPITAL_COMMUNITY): Admission: EM | Admit: 2019-12-13 | Discharge: 2019-12-13 | Discharge status: Absent without leave | Payer: Self-pay

## 2019-12-13 ENCOUNTER — Other Ambulatory Visit: Payer: Self-pay

## 2019-12-13 NOTE — ED Notes (Signed)
Called pt x3 for triage, no response. 

## 2020-01-12 ENCOUNTER — Emergency Department (HOSPITAL_COMMUNITY)
Admission: EM | Admit: 2020-01-12 | Discharge: 2020-01-12 | Disposition: A | Discharge status: Home / Self Care | Payer: Self-pay | Attending: Emergency Medicine | Admitting: Emergency Medicine

## 2020-01-12 ENCOUNTER — Encounter (HOSPITAL_COMMUNITY): Payer: Self-pay | Admitting: Emergency Medicine

## 2020-01-12 ENCOUNTER — Other Ambulatory Visit: Payer: Self-pay

## 2020-01-12 DIAGNOSIS — Z5321 Procedure and treatment not carried out due to patient leaving prior to being seen by health care provider: Secondary | ICD-10-CM | POA: Insufficient documentation

## 2020-01-12 DIAGNOSIS — K0889 Other specified disorders of teeth and supporting structures: Secondary | ICD-10-CM | POA: Insufficient documentation

## 2020-01-12 NOTE — ED Triage Notes (Signed)
Pt reports dental pain X3 days, OTC medication is not working to help the situation.

## 2020-01-12 NOTE — ED Notes (Signed)
Unable to locate patient when called for a room

## 2021-06-29 IMAGING — CT CT NECK W/ CM
4 of 5 series · 14 of 33 positions shown, 16 images · IV contrast (omnipaque)
Comparison: None.

CLINICAL DATA: Sore throat.  Rule out peritonsillar abscess.

EXAM:
CT NECK WITH CONTRAST
TECHNIQUE: Multidetector CT imaging of the neck was performed using the
standard protocol following the bolus administration of intravenous
contrast.
CONTRAST:  75mL OMNIPAQUE IOHEXOL 300 MG/ML  SOLN

[Series 3: neck 2.0 i31s 3 · axial · 0.53mm/px · z∈[+1081,+1253]mm · 4 of 144 slices shown, 5 images]
[im 29/144  soft-tissue]
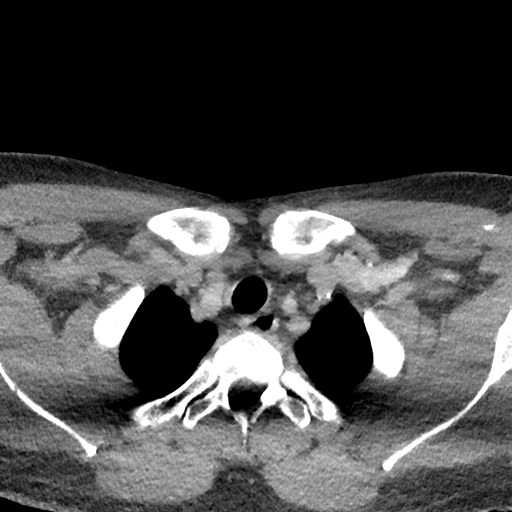
[im 29/144  bone]
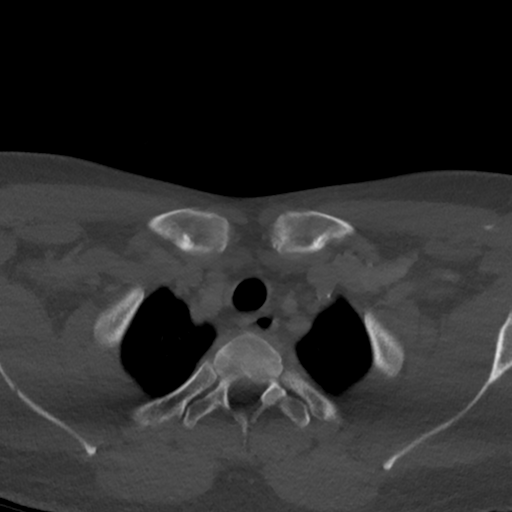
[im 58/144  bone]
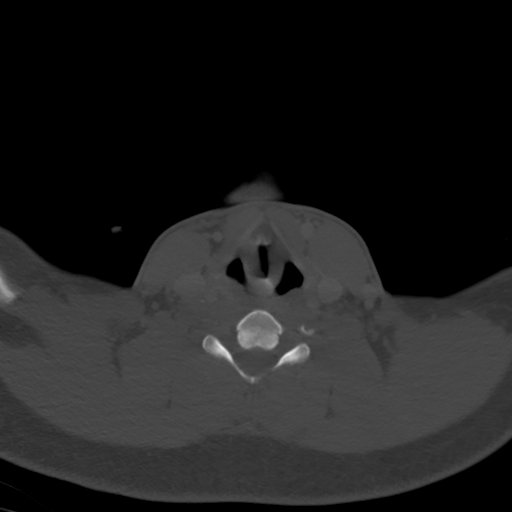
[im 86/144  bone]
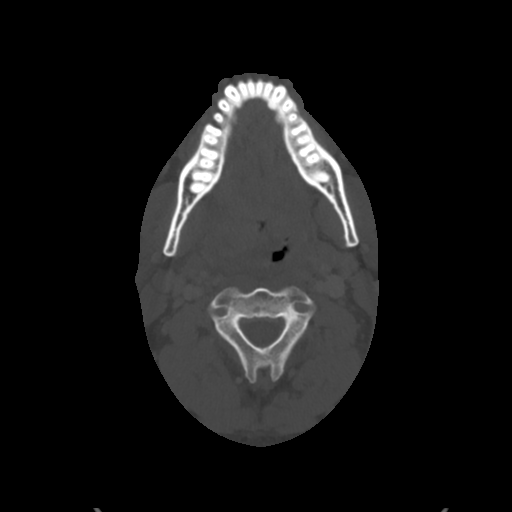
[im 115/144  bone]
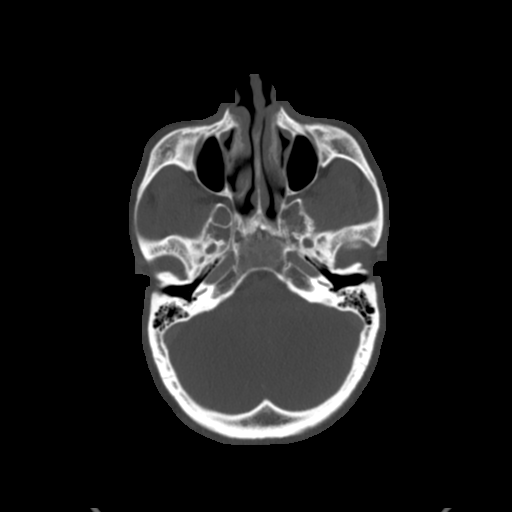

[Series 6: coronal st · coronal · 0.36mm/px · 3 of 134 slices shown]
[im 27/134  bone]
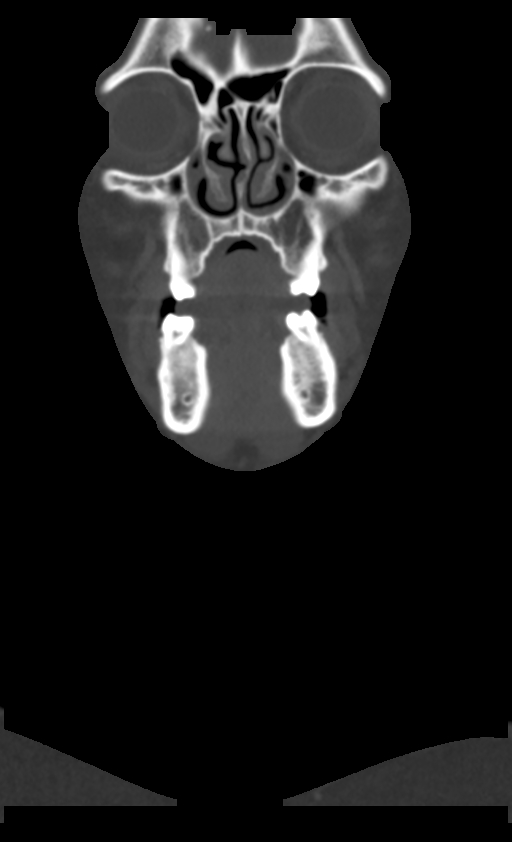
[im 54/134  bone]
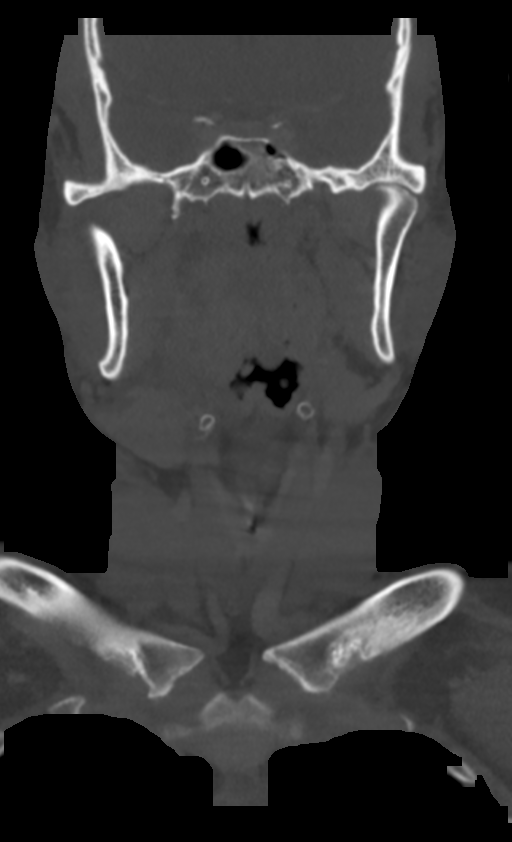
[im 80/134  bone]
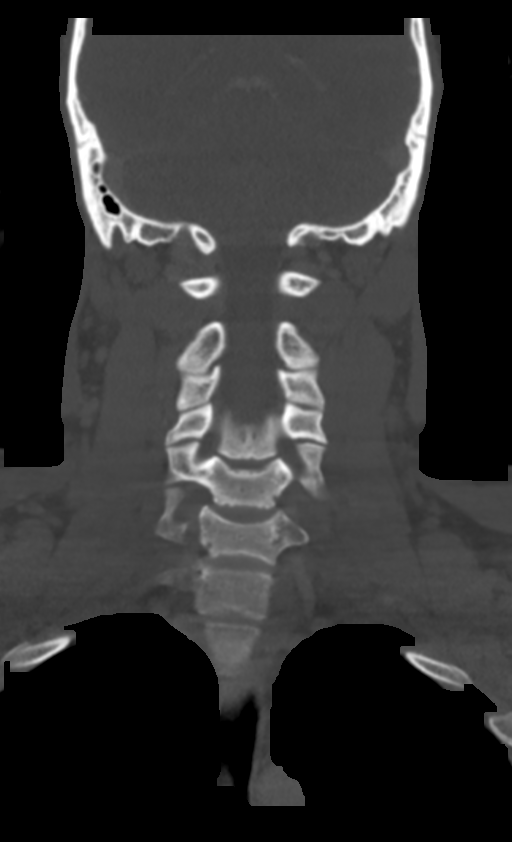

[Series 7: sagittal st · sagittal · 0.56mm/px · 5 of 89 slices shown, 6 images]
[im 30/89  bone]
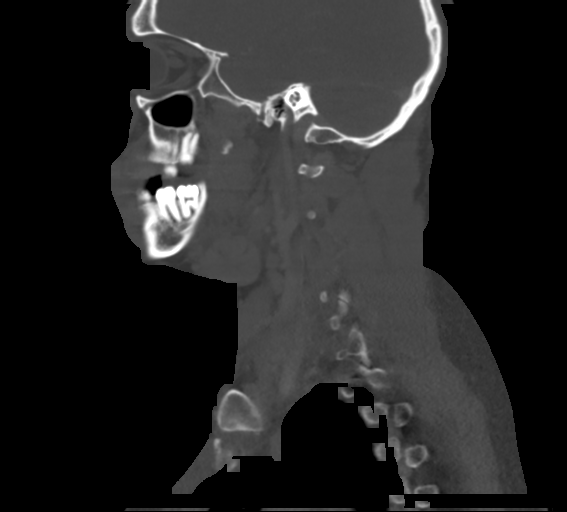
[im 37/89  bone]
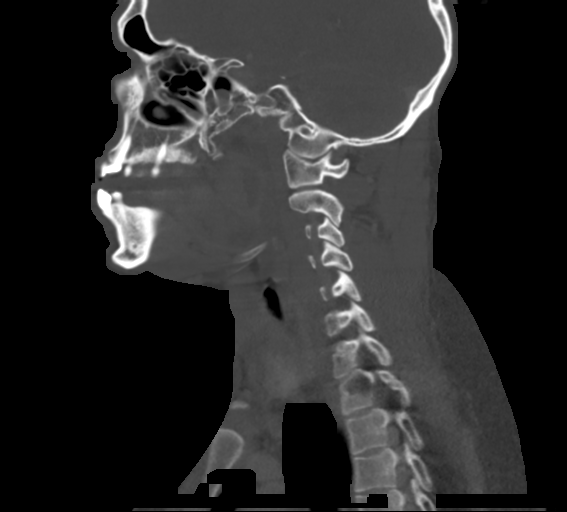
[im 45/89  soft-tissue]
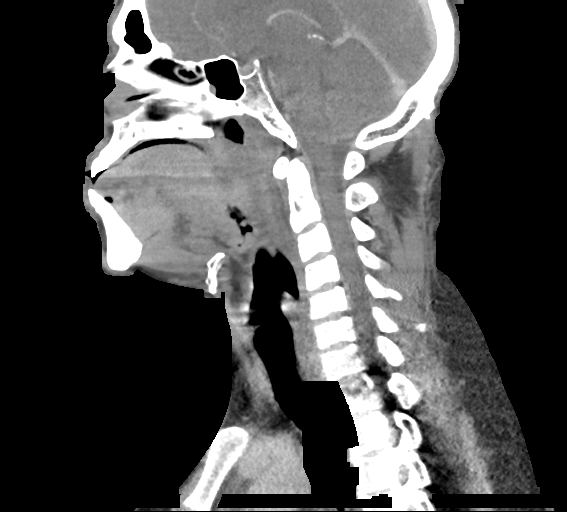
[im 45/89  bone]
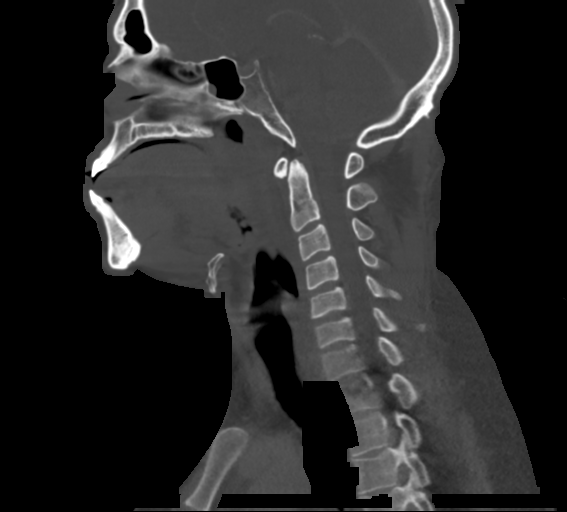
[im 52/89  bone]
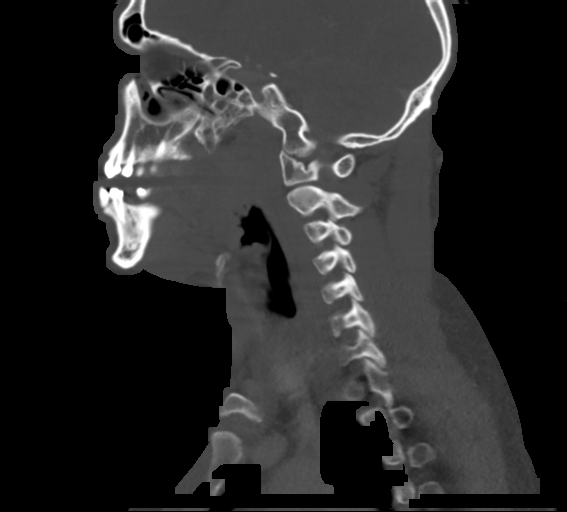
[im 59/89  bone]
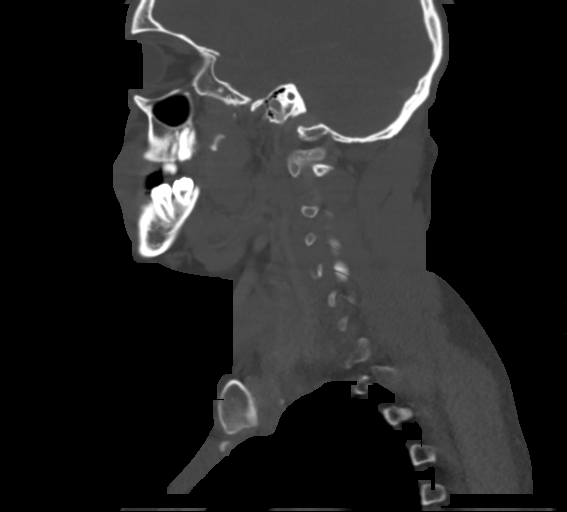

[Series 8: orthogonal st · axial · 0.37mm/px · z∈[+1083,+1139]mm · 2 of 143 slices shown]
[im 29/143  bone]
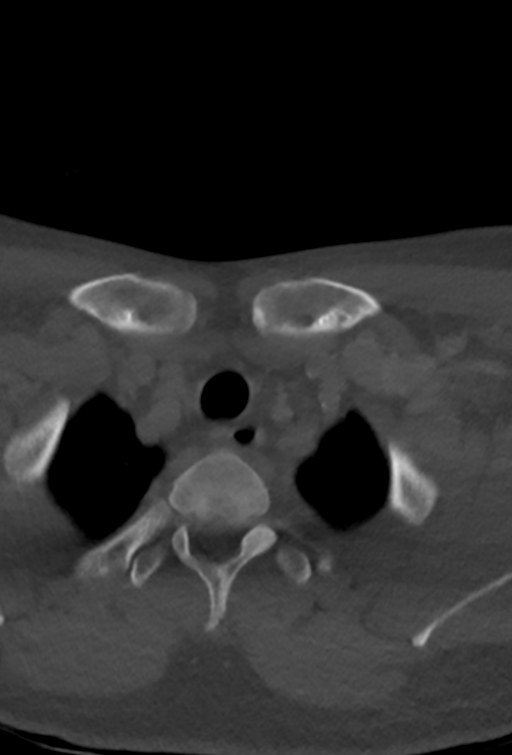
[im 57/143  bone]
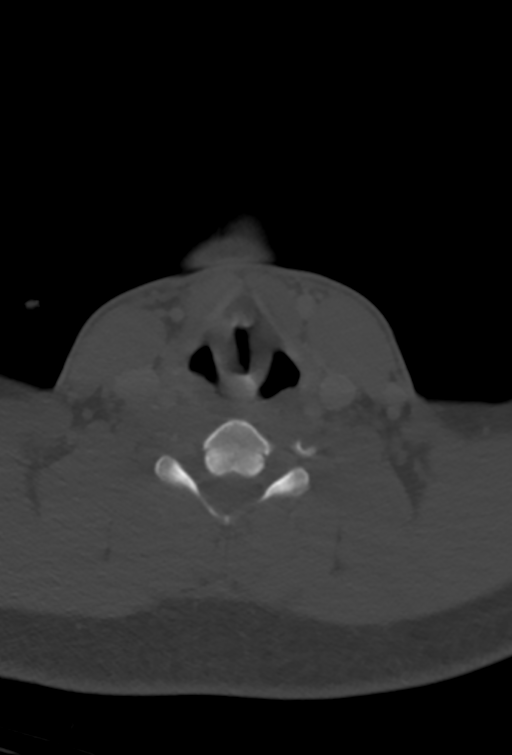

[14 of 33 positions shown; findings below may reference images not displayed]

FINDINGS: Pharynx and larynx: Asymmetric enlargement of the right tonsil. Rim
enhancing fluid collections in the right tonsil measuring 6 mm, 10
mm compatible with peritonsillar abscess. Edema extends into the
right lateral pharyngeal wall down to the hypopharynx. Vocal cords
normal. No airway compromise.

Salivary glands: Edema around the right submandibular gland
consistent with pharyngitis. No salivary gland mass or stone.

Thyroid: Negative

Lymph nodes: Right level 2 lymph node 13 mm. Left level 2 lymph node
11 mm. Multiple small lymph nodes in the neck bilaterally.

Vascular: Normal vascular enhancement.

Limited intracranial: Negative

Visualized orbits: Negative

Mastoids and visualized paranasal sinuses: Negative

Skeleton: Negative

Upper chest: Lung apices clear bilaterally

Other: None
IMPRESSION: Right-sided pharyngitis with peritonsillar abscess. Two adjacent
abscesses measuring 6 mm and 11 mm. Reactive adenopathy in the neck.

## 2021-12-22 ENCOUNTER — Encounter: Payer: Self-pay | Admitting: Emergency Medicine

## 2021-12-22 ENCOUNTER — Ambulatory Visit
Admission: EM | Admit: 2021-12-22 | Discharge: 2021-12-22 | Disposition: A | Discharge status: Home / Self Care | Payer: Self-pay | Attending: Urgent Care | Admitting: Urgent Care

## 2021-12-22 DIAGNOSIS — R112 Nausea with vomiting, unspecified: Secondary | ICD-10-CM

## 2021-12-22 DIAGNOSIS — A084 Viral intestinal infection, unspecified: Secondary | ICD-10-CM

## 2021-12-22 LAB — POCT URINALYSIS DIP (MANUAL ENTRY)
Bilirubin, UA: NEGATIVE
Blood, UA: NEGATIVE
Glucose, UA: NEGATIVE mg/dL
Ketones, POC UA: NEGATIVE mg/dL
Leukocytes, UA: NEGATIVE
Nitrite, UA: NEGATIVE
Protein Ur, POC: NEGATIVE mg/dL
Spec Grav, UA: >=1.03 — AB (ref 1.010–1.025)
Urobilinogen, UA: 0.2 E.U./dL
pH, UA: 6.5 (ref 5.0–8.0)

## 2021-12-22 MED ORDER — ONDANSETRON 8 MG PO TBDP: 8.0000 mg | ORAL_TABLET | Freq: Three times a day (TID) | ORAL | 0 refills | Status: DC | PRN

## 2021-12-22 MED ORDER — LOPERAMIDE HCL 2 MG PO CAPS: 2.0000 mg | ORAL_CAPSULE | Freq: Two times a day (BID) | ORAL | 0 refills | Status: DC | PRN

## 2021-12-22 NOTE — Discharge Instructions (Addendum)

## 2021-12-22 NOTE — ED Triage Notes (Signed)
Pt is present today with c/o diarrhea, left side abdominal pain, dizziness, and hot flashes. Pt sx started today

## 2021-12-22 NOTE — ED Provider Notes (Signed)
[facility]  Note:  This document was prepared using Dragon voice recognition software and may include unintentional dictation errors.  MRN: [id number] DOB: 1990/11/26  Subjective:   TRINI CHRISTIANSEN is a 31 y.o. male presenting for 1 day history of acute onset nausea, vomiting, profuse diarrhea, left-sided abdominal pain, subjective fever.  No bloody diarrhea, hematemesis, recent antibiotic use, recent hospitalizations, long distance travel outside the country.  No history of GI disorders.  No alcohol use.  Occasional marijuana use.  No fever, cough, chest pain, shortness of breath or wheezing.   No current facility-administered medications for this encounter.  Current Outpatient Medications:    amoxicillin (AMOXIL) 500 MG capsule, Take 1 capsule (500 mg total) by mouth 3 (three) times daily. Started on 2/2 for unknown length, Disp: 30 capsule, Rfl: 0   amoxicillin-clavulanate (AUGMENTIN) 875-125 MG per tablet, Take 1 tablet by mouth 2 (two) times daily., Disp: 20 tablet, Rfl: 0   HYDROcodone-acetaminophen (NORCO/VICODIN) 5-325 MG tablet, Take 1 tablet by mouth every 4 (four) hours as needed., Disp: 10 tablet, Rfl: 0   ibuprofen (ADVIL,MOTRIN) 800 MG tablet, Take 1 tablet (800 mg total) by mouth every 8 (eight) hours as needed., Disp: 30 tablet, Rfl: 0   naproxen (NAPROSYN) 500 MG tablet, Take 1 tablet (500 mg total) by mouth 2 (two) times daily., Disp: 30 tablet, Rfl: 0   traMADol (ULTRAM) 50 MG tablet, Take 50 mg by mouth every 6 (six) hours as needed for moderate pain., Disp: , Rfl:    No Known Allergies  History reviewed. No pertinent past medical history.   History reviewed. No pertinent surgical history.  History reviewed. No pertinent family history.  Social History   Tobacco Use   Smoking status: Every Day   Smokeless tobacco: Never  Substance Use Topics   Alcohol use: No   Drug use: No    ROS   Objective:   Vitals: BP 128/80   Pulse 65   Temp 98  F (36.7 C)   Resp 18   SpO2 98%   Physical Exam Constitutional:      General: He is not in acute distress.    Appearance: Normal appearance. He is well-developed and normal weight. He is not ill-appearing, toxic-appearing or diaphoretic.  HENT:     Head: Normocephalic and atraumatic.     Right Ear: External ear normal.     Left Ear: External ear normal.     Nose: Nose normal.     Mouth/Throat:     Mouth: Mucous membranes are moist.     Pharynx: Oropharynx is clear.  Eyes:     General: No scleral icterus.       Right eye: No discharge.        Left eye: No discharge.     Extraocular Movements: Extraocular movements intact.  Cardiovascular:     Rate and Rhythm: Normal rate and regular rhythm.     Heart sounds: Normal heart sounds. No murmur heard.    No friction rub. No gallop.  Pulmonary:     Effort: Pulmonary effort is normal. No respiratory distress.     Breath sounds: Normal breath sounds. No stridor. No wheezing, rhonchi or rales.  Abdominal:     General: Bowel sounds are increased. There is no distension.     Palpations: Abdomen is soft. There is no mass.     Tenderness: There is no abdominal tenderness. There is no right CVA tenderness, left CVA tenderness, guarding or rebound.  Musculoskeletal:  Cervical back: Normal range of motion.  Neurological:     Mental Status: He is alert and oriented to person, place, and time.  Psychiatric:        Mood and Affect: Mood normal.        Behavior: Behavior normal.        Thought Content: Thought content normal.        Judgment: Judgment normal.     Results for orders placed or performed during the hospital encounter of 12/22/21 (from the past 24 hour(s))  POCT urinalysis dipstick     Status: Abnormal   Collection Time: 12/22/21 12:10 PM  Result Value Ref Range   Color, UA yellow yellow   Clarity, UA clear clear   Glucose, UA negative negative mg/dL   Bilirubin, UA negative negative   Ketones, POC UA negative negative  mg/dL   Spec Grav, UA >=6.387 (A) 1.010 - 1.025   Blood, UA negative negative   pH, UA 6.5 5.0 - 8.0   Protein Ur, POC negative negative mg/dL   Urobilinogen, UA 0.2 0.2 or 1.0 E.U./dL   Nitrite, UA Negative Negative   Leukocytes, UA Negative Negative    Assessment and Plan :   PDMP not reviewed this encounter.  1. Viral gastroenteritis   2. Nausea vomiting and diarrhea    No signs of significant dehydration. Will manage for suspected viral gastroenteritis with supportive care.  Recommended patient hydrate well, eat light meals and maintain electrolytes.  Will use Zofran and Imodium for nausea, vomiting and diarrhea. Counseled patient on potential for adverse effects with medications prescribed/recommended today, ER and return-to-clinic precautions discussed, patient verbalized understanding.    Wallis Bamberg, [place] 12/22/21 [id number]

## 2022-04-20 ENCOUNTER — Encounter: Payer: Self-pay | Admitting: Emergency Medicine

## 2022-04-20 ENCOUNTER — Ambulatory Visit
Admission: EM | Admit: 2022-04-20 | Discharge: 2022-04-20 | Disposition: A | Discharge status: Home / Self Care | Payer: Medicaid Other | Attending: Physician Assistant | Admitting: Physician Assistant

## 2022-04-20 ENCOUNTER — Other Ambulatory Visit: Payer: Self-pay

## 2022-04-20 DIAGNOSIS — L0291 Cutaneous abscess, unspecified: Secondary | ICD-10-CM

## 2022-04-20 MED ORDER — TRAMADOL HCL 50 MG PO TABS: 50.0000 mg | ORAL_TABLET | Freq: Four times a day (QID) | ORAL | 0 refills | Status: DC | PRN

## 2022-04-20 MED ORDER — DOXYCYCLINE HYCLATE 100 MG PO CAPS: 100.0000 mg | ORAL_CAPSULE | Freq: Two times a day (BID) | ORAL | 0 refills | Status: DC

## 2022-04-20 NOTE — ED Triage Notes (Signed)
Pt here for abscess near rectum and testicles with pain and swelling  x 2 days

## 2022-04-20 NOTE — ED Provider Notes (Signed)
[facility]    CSN: [id number] Arrival date & time: 04/20/22  1555      History   Chief Complaint Chief Complaint  Patient presents with   Abscess    HPI Jose Mendez is a 32 y.o. male.   Patient here today for evaluation of abscess in left groin area that he first noticed 2 days ago.  He states that area is exquisitely tender.  He has not had any drainage from same.  He denies any fevers.  He has not had any nausea or vomiting.  Patient has tried taking ibuprofen without resolution of pain and request medication to help with pain relief.  The history is provided by the patient.  Abscess Associated symptoms: no fever, no nausea and no vomiting     History reviewed. No pertinent past medical history.  There are no problems to display for this patient.   History reviewed. No pertinent surgical history.     Home Medications    Prior to Admission medications   Medication Sig Start Date End Date Taking? Authorizing Provider  doxycycline (VIBRAMYCIN) 100 MG capsule Take 1 capsule (100 mg total) by mouth 2 (two) times daily. 04/20/22  Yes Francene Finders, PA-C  traMADol (ULTRAM) 50 MG tablet Take 1 tablet (50 mg total) by mouth every 6 (six) hours as needed. 04/20/22  Yes Francene Finders, PA-C  loperamide (IMODIUM) 2 MG capsule Take 1 capsule (2 mg total) by mouth 2 (two) times daily as needed for diarrhea or loose stools. 12/22/21   Jaynee Eagles, PA-C  ondansetron (ZOFRAN-ODT) 8 MG disintegrating tablet Take 1 tablet (8 mg total) by mouth every 8 (eight) hours as needed for nausea or vomiting. 12/22/21   Jaynee Eagles, PA-C    Family History History reviewed. No pertinent family history.  Social History Social History   Tobacco Use   Smoking status: Every Day   Smokeless tobacco: Never  Substance Use Topics   Alcohol use: No   Drug use: No     Allergies   Patient has no known allergies.   Review of Systems Review of Systems  Constitutional:  Negative  for chills and fever.  Eyes:  Negative for discharge and redness.  Respiratory:  Negative for shortness of breath.   Gastrointestinal:  Negative for nausea and vomiting.  Skin:  Negative for color change and wound.  Neurological:  Negative for numbness.     Physical Exam Triage Vital Signs ED Triage Vitals  Enc Vitals Group     BP 04/20/22 1637 (!) 151/83     Pulse Rate 04/20/22 1637 (!) 102     Resp 04/20/22 1637 18     Temp 04/20/22 1637 98.4 F (36.9 C)     Temp Source 04/20/22 1637 Oral     SpO2 04/20/22 1637 98 %     Weight --      Height --      Head Circumference --      Peak Flow --      Pain Score 04/20/22 1638 10     Pain Loc --      Pain Edu? --      Excl. in [place]? --    No data found.  Updated Vital Signs BP (!) 151/83 (BP Location: Left Arm)   Pulse (!) 102   Temp 98.4 F (36.9 C) (Oral)   Resp 18   SpO2 98%   Physical Exam Vitals and nursing note reviewed.  Constitutional:  General: He is not in acute distress.    Appearance: Normal appearance. He is not ill-appearing.  HENT:     Head: Normocephalic and atraumatic.  Eyes:     Conjunctiva/sclera: Conjunctivae normal.  Cardiovascular:     Rate and Rhythm: Normal rate.  Pulmonary:     Effort: Pulmonary effort is normal. No respiratory distress.  Skin:    Comments: Approximately 3 cm area of induration and tenderness noted to left groin area just posterior to the testicle, no active drainage noted.  Neurological:     Mental Status: He is alert.  Psychiatric:        Mood and Affect: Mood normal.        Behavior: Behavior normal.        Thought Content: Thought content normal.      UC Treatments / Results  Labs (all labs ordered are listed, but only abnormal results are displayed) Labs Reviewed - No data to display  EKG   Radiology No results found.  Procedures Procedures (including critical care time)  Medications Ordered in UC Medications - No data to display  Initial  Impression / Assessment and Plan / UC Course  I have reviewed the triage vital signs and the nursing notes.  Pertinent labs & imaging results that were available during my care of the patient were reviewed by me and considered in my medical decision making (see chart for details).   Given location of abscess I&D deferred today.  Doxycycline prescribed as well as tramadol for pain relief.  Discussed that tramadol may cause drowsiness.  Advised to use with caution.  Recommended warm soaks/compresses to help promote drainage.  Advised further evaluation in the emergency room with any worsening or if no improvement over the next 24 to 48 hours.  Patient expresses understanding.  Final Clinical Impressions(s) / UC Diagnoses   Final diagnoses:  Abscess   Discharge Instructions   None    ED Prescriptions     Medication Sig Dispense Auth. Provider   doxycycline (VIBRAMYCIN) 100 MG capsule Take 1 capsule (100 mg total) by mouth 2 (two) times daily. 20 capsule Ewell Poe F, PA-C   traMADol (ULTRAM) 50 MG tablet Take 1 tablet (50 mg total) by mouth every 6 (six) hours as needed. 15 tablet Francene Finders, PA-C      I have reviewed the PDMP during this encounter.   Francene Finders, PA-C 04/20/22 [phone]

## 2022-11-03 ENCOUNTER — Emergency Department (HOSPITAL_COMMUNITY)
Admission: EM | Admit: 2022-11-03 | Discharge: 2022-11-03 | Disposition: A | Discharge status: Home / Self Care | Payer: Medicaid Other | Attending: Emergency Medicine | Admitting: Emergency Medicine

## 2022-11-03 ENCOUNTER — Emergency Department (HOSPITAL_COMMUNITY): Payer: Medicaid Other

## 2022-11-03 ENCOUNTER — Encounter (HOSPITAL_COMMUNITY): Payer: Self-pay

## 2022-11-03 ENCOUNTER — Other Ambulatory Visit: Payer: Self-pay

## 2022-11-03 DIAGNOSIS — D72829 Elevated white blood cell count, unspecified: Secondary | ICD-10-CM | POA: Diagnosis not present

## 2022-11-03 DIAGNOSIS — R079 Chest pain, unspecified: Secondary | ICD-10-CM | POA: Insufficient documentation

## 2022-11-03 LAB — CBC
HCT: 44.5 % (ref 39.0–52.0)
Hemoglobin: 14.7 g/dL (ref 13.0–17.0)
MCH: 28.1 pg (ref 26.0–34.0)
MCHC: 33 g/dL (ref 30.0–36.0)
MCV: 85.1 fL (ref 80.0–100.0)
Platelets: 220 10*3/uL (ref 150–400)
RBC: 5.23 MIL/uL (ref 4.22–5.81)
RDW: 13.4 % (ref 11.5–15.5)
WBC: 11.2 10*3/uL — ABNORMAL HIGH (ref 4.0–10.5)
nRBC: 0 % (ref 0.0–0.2)

## 2022-11-03 LAB — BASIC METABOLIC PANEL
Anion gap: 12 (ref 5–15)
BUN: 13 mg/dL (ref 6–20)
CO2: 24 mmol/L (ref 22–32)
Calcium: 9.2 mg/dL (ref 8.9–10.3)
Chloride: 105 mmol/L (ref 98–111)
Creatinine, Ser: 1.13 mg/dL (ref 0.61–1.24)
GFR, Estimated: >60 mL/min (ref >60)
Glucose, Bld: 84 mg/dL (ref 70–99)
Potassium: 4 mmol/L (ref 3.5–5.1)
Sodium: 141 mmol/L (ref 135–145)

## 2022-11-03 LAB — LIPASE, BLOOD: Lipase: 30 U/L (ref 11–51)

## 2022-11-03 LAB — BRAIN NATRIURETIC PEPTIDE: B Natriuretic Peptide: 31 pg/mL (ref 0.0–100.0)

## 2022-11-03 LAB — HEPATIC FUNCTION PANEL
ALT: 26 U/L (ref 0–44)
AST: 22 U/L (ref 15–41)
Albumin: 4.1 g/dL (ref 3.5–5.0)
Alkaline Phosphatase: 60 U/L (ref 38–126)
Bilirubin, Direct: 0.2 mg/dL (ref 0.0–0.2)
Indirect Bilirubin: 0.5 mg/dL (ref 0.3–0.9)
Total Bilirubin: 0.7 mg/dL (ref 0.3–1.2)
Total Protein: 7.3 g/dL (ref 6.5–8.1)

## 2022-11-03 LAB — D-DIMER, QUANTITATIVE: D-Dimer, Quant: 0.83 ug/mL-FEU — ABNORMAL HIGH (ref 0.00–0.50)

## 2022-11-03 LAB — TROPONIN I (HIGH SENSITIVITY)
Troponin I (High Sensitivity): 4 ng/L (ref <18)
Troponin I (High Sensitivity): 4 ng/L (ref <18)

## 2022-11-03 MED ORDER — IOHEXOL 350 MG/ML SOLN
75.0000 mL | Freq: Once | INTRAVENOUS | Status: AC | PRN
  Administered 2022-11-03: 75 mL via INTRAVENOUS

## 2022-11-03 MED ORDER — IBUPROFEN 400 MG PO TABS
600.0000 mg | ORAL_TABLET | Freq: Once | ORAL | Status: AC
  Administered 2022-11-03: 600 mg via ORAL
  Filled 2022-11-03: qty 1

## 2022-11-03 MED ORDER — NAPROXEN 500 MG PO TABS: 500.0000 mg | ORAL_TABLET | Freq: Two times a day (BID) | ORAL | 0 refills | Status: DC

## 2022-11-03 NOTE — Discharge Instructions (Addendum)
It was a pleasure taking care of you today.  As discussed, your CT scan did not show a blood clot.  Your cardiac labs were normal.  Your chest pain could possibly related to musculoskeletal pain.  I am sending you home with pain medication.  Take twice a day as needed for pain.  Follow-up with PCP if symptoms do not improve over the next few days.  Return to the ER for any worsening symptoms.

## 2022-11-03 NOTE — ED Provider Notes (Signed)
[place] EM[facility] DEPARTMENT AT [facility] Provider Note   CSN: [id number] Arrival date & time: 11/03/22  1516     History  No chief complaint on file.   Jose Mendez is a 32 y.o. male with no significant past medical history who presents to the ED due to sudden onset of chest pain that started while sitting at work earlier today.  Patient states pain radiated up from his left arm into his chest.  Describes it as a sharp shooting sensation. Initially had numbness/tingling in left hand which quickly resolved. Patient admits to associated shortness of breath.  Pain worse with deep inspiration.  No history of blood clots.  Denies recent surgeries, recent long immobilizations, and hormonal treatments.  No lower extremity edema.  Also admits to upper abdominal pain.  No cardiac history.  No medical conditions. Admits to some nausea, no vomiting.   History obtained from patient and past medical records. No interpreter used during encounter.       Home Medications Prior to Admission medications   Medication Sig Start Date End Date Taking? Authorizing Provider  naproxen (NAPROSYN) 500 MG tablet Take 1 tablet (500 mg total) by mouth 2 (two) times daily. 11/03/22  Yes Dhalia Zingaro, Merla Riches, PA-C  doxycycline (VIBRAMYCIN) 100 MG capsule Take 1 capsule (100 mg total) by mouth 2 (two) times daily. 04/20/22   Tomi Bamberger, PA-C  loperamide (IMODIUM) 2 MG capsule Take 1 capsule (2 mg total) by mouth 2 (two) times daily as needed for diarrhea or loose stools. 12/22/21   Wallis Bamberg, PA-C  ondansetron (ZOFRAN-ODT) 8 MG disintegrating tablet Take 1 tablet (8 mg total) by mouth every 8 (eight) hours as needed for nausea or vomiting. 12/22/21   Wallis Bamberg, PA-C  traMADol (ULTRAM) 50 MG tablet Take 1 tablet (50 mg total) by mouth every 6 (six) hours as needed. 04/20/22   Tomi Bamberger, PA-C      Allergies    Patient has no known allergies.    Review of Systems   Review of Systems   Constitutional:  Negative for chills and fever.  Respiratory:  Positive for shortness of breath.   Cardiovascular:  Positive for chest pain.  Gastrointestinal:  Positive for abdominal pain and nausea. Negative for vomiting.    Physical Exam Updated Vital Signs BP 115/83 (BP Location: Right Arm)   Pulse 61   Temp 98.7 F (37.1 C) (Oral)   Resp 20   Wt 104.3 kg   SpO2 100%  Physical Exam Vitals and nursing note reviewed.  Constitutional:      General: He is not in acute distress.    Appearance: He is not ill-appearing.  HENT:     Head: Normocephalic.  Eyes:     Pupils: Pupils are equal, round, and reactive to light.  Cardiovascular:     Rate and Rhythm: Normal rate and regular rhythm.     Pulses: Normal pulses.     Heart sounds: Normal heart sounds. No murmur heard.    No friction rub. No gallop.  Pulmonary:     Effort: Pulmonary effort is normal.     Breath sounds: Normal breath sounds.  Chest:     Comments: Reproducible anterior chest wall tenderness.  No crepitus or deformity. Abdominal:     General: Abdomen is flat. There is no distension.     Palpations: Abdomen is soft.     Tenderness: There is abdominal tenderness. There is no guarding or rebound.  Comments: Very mild upper abdominal tenderness  Musculoskeletal:        General: Normal range of motion.     Cervical back: Neck supple.  Skin:    General: Skin is warm and dry.  Neurological:     General: No focal deficit present.     Mental Status: He is alert.  Psychiatric:        Mood and Affect: Mood normal.        Behavior: Behavior normal.     ED Results / Procedures / Treatments   Labs (all labs ordered are listed, but only abnormal results are displayed) Labs Reviewed  CBC - Abnormal; Notable for the following components:      Result Value   WBC 11.2 (*)    All other components within normal limits  D-DIMER, QUANTITATIVE (NOT AT [facility]) - Abnormal; Notable for the following components:    D-Dimer, Quant 0.83 (*)    All other components within normal limits  BASIC METABOLIC PANEL  BRAIN NATRIURETIC PEPTIDE  HEPATIC FUNCTION PANEL  LIPASE, BLOOD  TROPONIN I (HIGH SENSITIVITY)  TROPONIN I (HIGH SENSITIVITY)    EKG EKG Interpretation Date/Time:  Tuesday November 03 2022 16:01:51 EDT Ventricular Rate:  81 PR Interval:  158 QRS Duration:  78 QT Interval:  346 QTC Calculation: 401 R Axis:   61  Text Interpretation: Normal sinus rhythm Normal ECG No previous ECGs available Confirmed by Benjiman Core [phone]) on 11/03/2022 9:48:15 PM  Radiology CT Angio Chest PE W and/or Wo Contrast  Result Date: 11/03/2022 CLINICAL DATA:  Positive D-dimer short of breath EXAM: CT ANGIOGRAPHY CHEST WITH CONTRAST TECHNIQUE: Multidetector CT imaging of the chest was performed using the standard protocol during bolus administration of intravenous contrast. Multiplanar CT image reconstructions and MIPs were obtained to evaluate the vascular anatomy. RADIATION DOSE REDUCTION: This exam was performed according to the departmental dose-optimization program which includes automated exposure control, adjustment of the mA and/or kV according to patient size and/or use of iterative reconstruction technique. CONTRAST:  75mL OMNIPAQUE IOHEXOL 350 MG/ML SOLN COMPARISON:  11/03/2022 FINDINGS: Cardiovascular: Satisfactory opacification of the pulmonary arteries to the segmental level. No evidence of pulmonary embolism. Normal heart size. No pericardial effusion. Nonaneurysmal aorta. Mediastinum/Nodes: No enlarged mediastinal, hilar, or axillary lymph nodes. Thyroid gland, trachea, and esophagus demonstrate no significant findings. Lungs/Pleura: Lungs are clear. No pleural effusion or pneumothorax. Upper Abdomen: No acute finding. 11 mm hypodensity in the posterior right hepatic lobe possibly a cyst or hamartoma. Musculoskeletal: No acute osseous abnormality Review of the MIP images confirms the above findings.  IMPRESSION: Negative for acute pulmonary embolus.  Clear lung fields. Electronically Signed   By: Jasmine Pang M.D.   On: 11/03/2022 21:37   DG Chest 2 View  Result Date: 11/03/2022 CLINICAL DATA:  cp/ sob EXAM: CHEST - 2 VIEW COMPARISON:  CXR 11/25/08 FINDINGS: The heart size and mediastinal contours are within normal limits. Both lungs are clear. The visualized skeletal structures are unremarkable. IMPRESSION: No focal airspace opacity Electronically Signed   By: Lorenza Cambridge M.D.   On: 11/03/2022 16:23    Procedures Procedures    Medications Ordered in ED Medications  ibuprofen (ADVIL) tablet 600 mg (600 mg Oral Given 11/03/22 2018)  iohexol (OMNIPAQUE) 350 MG/ML injection 75 mL (75 mLs Intravenous Contrast Given 11/03/22 2049)    ED Course/ Medical Decision Making/ A&P Clinical Course as of 11/03/22 2151  Tue Nov 03, 2022  1757 B Natriuretic Peptide: 31.0 [CA]  Clinical Course User Index [CA] Mannie Stabile, PA-C                             Medical Decision Making Amount and/or Complexity of Data Reviewed Independent Historian: spouse Labs: ordered. Decision-making details documented in ED Course. Radiology: ordered and independent interpretation performed. Decision-making details documented in ED Course. ECG/medicine tests: ordered and independent interpretation performed. Decision-making details documented in ED Course.  Risk Prescription drug management.   This patient presents to the ED for concern of chest pain, this involves an extensive number of treatment options, and is a complaint that carries with it a high risk of complications and morbidity.  The differential diagnosis includes ACS, PE, aortic dissection, MSK etiology, PNA, etc  32 year old male presents to the ED due to sudden onset of chest pain associated with shortness of breath while sitting at work.  Pain radiated up from left upper extremity to chest.  Admits to some nausea.  Chest pain pleuritic in  nature.  No history of blood clots.  Upon arrival, vitals all within normal limits.  Patient in no acute distress.  Lungs clear to auscultation bilaterally.  No lower extremity edema.  Negative Homan sign bilaterally. TTP throughout anterior chest wall without crepitus or deformity.  Very mild upper abdominal tenderness.  Routine labs ordered in triage.  Cardiac labs also ordered.  Added abdominal labs to rule out abdominal etiology of pain given tenderness on exam. Added d-dimer to rule out PE due to pleuritic nature.   CBC significant for mild leukocytosis at 11.2.  Initial troponin normal.  BMP unremarkable.  Normal renal function.  No major electrolyte derangements. BNP normal. Low suspicion for CHF.  Chest x-ray personally reviewed and interpreted which is negative for signs of pneumonia, pneumothorax, widened mediastinum.  D-dimer elevated.  CTA chest ordered.  Hepatic function panel normal.  Lipase normal.  Low suspicion for pancreatitis. Low suspicion for acute cholecystitis. Troponin flat. EKG NSR, no signs of acute ischemia. Low suspicion for ACS.   CTA chest negative for PE.  Unknown etiology of chest pain. Presentation nonconcerning for dissection. Possible MSK etiology given reproducible nature on exam.  Patient discharged with naproxen.  Advised to follow-up with PCP if symptoms not improve over the next few days. Strict ED precautions discussed with patient. Patient states understanding and agrees to plan. Patient discharged home in no acute distress and stable vitals  No PCP Lives at home       Final Clinical Impression(s) / ED Diagnoses Final diagnoses:  Nonspecific chest pain    Rx / DC Orders ED Discharge Orders          Ordered    naproxen (NAPROSYN) 500 MG tablet  2 times daily        11/03/22 2146              Jesusita Oka 11/03/22 2151    Benjiman Core, MD 11/03/22 2255

## 2022-11-03 NOTE — ED Provider Triage Note (Signed)
Emergency Medicine Provider Triage Evaluation Note  Jose Mendez , a 32 y.o. male  was evaluated in triage.  Pt complains of Chest pain and throwing a heavy bag shortness of breath after work today.  At first she had numbness and tingling in the left hand now that is resolved.  He complains of pain in his upper back chest that is worse with deep breathing.  Review of Systems  Positive: Chest pain Negative: Fever  Physical Exam  BP 114/80   Pulse 77   Temp 98.9 F (37.2 C) (Oral)   Resp 18   Wt 104.3 kg   SpO2 99%  Gen:   Awake, no distress   Resp:  Normal effort  MSK:   Moves extremities without difficulty  Other:  No left arm weakness  Medical Decision Making  Medically screening exam initiated at 3:42 PM.  Appropriate orders placed.  Orma Flaming was informed that the remainder of the evaluation will be completed by another provider, this initial triage assessment does not replace that evaluation, and the importance of remaining in the ED until their evaluation is complete.     Arthor Captain, PA-C 11/03/22 [phone]

## 2022-11-03 NOTE — ED Triage Notes (Signed)
Pt at work putting bags away, had sudden shooting pain went up L arm to chest along with sob; endorses some nausea at first, none currently; denies cardiac hx

## 2024-03-18 ENCOUNTER — Other Ambulatory Visit: Payer: Self-pay

## 2024-03-18 ENCOUNTER — Encounter (HOSPITAL_COMMUNITY): Payer: Self-pay | Admitting: Emergency Medicine

## 2024-03-18 ENCOUNTER — Ambulatory Visit (HOSPITAL_COMMUNITY)
Admission: EM | Admit: 2024-03-18 | Discharge: 2024-03-18 | Disposition: A | Discharge status: Home / Self Care | Attending: Family Medicine | Admitting: Family Medicine

## 2024-03-18 DIAGNOSIS — K047 Periapical abscess without sinus: Secondary | ICD-10-CM

## 2024-03-18 MED ORDER — KETOROLAC TROMETHAMINE 30 MG/ML IJ SOLN
INTRAMUSCULAR | Status: AC
  Filled 2024-03-18: qty 1

## 2024-03-18 MED ORDER — AMOXICILLIN-POT CLAVULANATE 875-125 MG PO TABS: 1.0000 | ORAL_TABLET | Freq: Two times a day (BID) | ORAL | 0 refills | Status: AC

## 2024-03-18 MED ORDER — KETOROLAC TROMETHAMINE 30 MG/ML IJ SOLN
30.0000 mg | Freq: Once | INTRAMUSCULAR | Status: AC
  Administered 2024-03-18: 30 mg via INTRAMUSCULAR

## 2024-03-18 MED ORDER — KETOROLAC TROMETHAMINE 10 MG PO TABS: 10.0000 mg | ORAL_TABLET | Freq: Four times a day (QID) | ORAL | 0 refills | Status: AC | PRN

## 2024-03-18 NOTE — ED Provider Notes (Signed)
 [facility]    CSN: [id number] Arrival date & time: 03/18/24  1156      History   Chief Complaint No chief complaint on file.   HPI Jose Mendez is a 33 y.o. male.   HPI  Here for pain in his right lower teeth.  About 1 year ago a tooth broke off in that area and it started bothering him about 2 nights ago.  No fever  It is swelling around that area.  NKDA  He has been taking some old amoxicillin  plain that he had  Also he has been taking some ibuprofen  800 mg that has not relieved his pain very well. History reviewed. No pertinent past medical history.  There are no active problems to display for this patient.   History reviewed. No pertinent surgical history.     Home Medications    Prior to Admission medications   Medication Sig Start Date End Date Taking? Authorizing Provider  amoxicillin -clavulanate (AUGMENTIN ) 875-125 MG tablet Take 1 tablet by mouth 2 (two) times daily for 7 days. 03/18/24 03/25/24 Yes Vonna Sharlet POUR, MD  ketorolac  (TORADOL ) 10 MG tablet Take 1 tablet (10 mg total) by mouth every 6 (six) hours as needed (pain). 03/18/24  Yes Vonna Sharlet POUR, MD    Family History History reviewed. No pertinent family history.  Social History Social History   Tobacco Use   Smoking status: Never   Smokeless tobacco: Never  Vaping Use   Vaping status: Never Used  Substance Use Topics   Alcohol use: Yes   Drug use: Yes    Types: Marijuana     Allergies   Patient has no known allergies.   Review of Systems Review of Systems   Physical Exam Triage Vital Signs ED Triage Vitals  Encounter Vitals Group     BP 03/18/24 1317 128/86     Girls Systolic BP Percentile --      Girls Diastolic BP Percentile --      Boys Systolic BP Percentile --      Boys Diastolic BP Percentile --      Pulse Rate 03/18/24 1317 80     Resp 03/18/24 1317 18     Temp 03/18/24 1317 99 F (37.2 C)     Temp Source 03/18/24 1317 Oral     SpO2  03/18/24 1317 96 %     Weight --      Height --      Head Circumference --      Peak Flow --      Pain Score 03/18/24 1314 10     Pain Loc --      Pain Education --      Exclude from Growth Chart --    No data found.  Updated Vital Signs BP 128/86 (BP Location: Right Arm) Comment (BP Location): large cuff  Pulse 80   Temp 99 F (37.2 C) (Oral)   Resp 18   SpO2 96%   Visual Acuity Right Eye Distance:   Left Eye Distance:   Bilateral Distance:    Right Eye Near:   Left Eye Near:    Bilateral Near:     Physical Exam Vitals reviewed.  Constitutional:      General: He is not in acute distress.    Appearance: He is not ill-appearing, toxic-appearing or diaphoretic.  HENT:     Mouth/Throat:     Mouth: Mucous membranes are moist.     Comments: There are some carious  teeth on the right lower dental ridge.  Mild swelling of the buccal mucosa is noted. Eyes:     Extraocular Movements: Extraocular movements intact.     Conjunctiva/sclera: Conjunctivae normal.     Pupils: Pupils are equal, round, and reactive to light.  Cardiovascular:     Rate and Rhythm: Normal rate and regular rhythm.     Heart sounds: No murmur heard. Pulmonary:     Effort: Pulmonary effort is normal.     Breath sounds: Normal breath sounds.  Musculoskeletal:     Cervical back: Neck supple.  Lymphadenopathy:     Cervical: No cervical adenopathy.  Skin:    Coloration: Skin is not pale.  Neurological:     General: No focal deficit present.     Mental Status: He is alert and oriented to person, place, and time.  Psychiatric:        Behavior: Behavior normal.      UC Treatments / Results  Labs (all labs ordered are listed, but only abnormal results are displayed) Labs Reviewed - No data to display  EKG   Radiology No results found.  Procedures Procedures (including critical care time)  Medications Ordered in UC Medications  ketorolac  (TORADOL ) 30 MG/ML injection 30 mg (has no  administration in time range)    Initial Impression / Assessment and Plan / UC Course  I have reviewed the triage vital signs and the nursing notes.  Pertinent labs & imaging results that were available during my care of the patient were reviewed by me and considered in my medical decision making (see chart for details).     Augmentin  is sent in for the dental infection.  Toradol  injection is given here for the pain and Toradol  tablets are sent to the pharmacy.  He is given a list of lower cost dental providers in the area. Final Clinical Impressions(s) / UC Diagnoses   Final diagnoses:  Dental infection     Discharge Instructions      You have been given a shot of Toradol  30 mg today.  Take amoxicillin -clavulanate 875 mg--1 tab twice daily with food for 7 days  Ketorolac  10 mg tablets--take 1 tablet every 6 hours as needed for pain.  This is the same medicine that is in the shot we just gave you       ED Prescriptions     Medication Sig Dispense Auth. Provider   amoxicillin -clavulanate (AUGMENTIN ) 875-125 MG tablet Take 1 tablet by mouth 2 (two) times daily for 7 days. 14 tablet Garrison Michie, Sharlet POUR, MD   ketorolac  (TORADOL ) 10 MG tablet Take 1 tablet (10 mg total) by mouth every 6 (six) hours as needed (pain). 20 tablet Guida Asman K, MD      PDMP not reviewed this encounter.   Vonna Sharlet POUR, MD 03/18/24 [phone]

## 2024-03-18 NOTE — Discharge Instructions (Signed)
 You have been given a shot of Toradol  30 mg today.  Take amoxicillin -clavulanate 875 mg--1 tab twice daily with food for 7 days  Ketorolac  10 mg tablets--take 1 tablet every 6 hours as needed for pain.  This is the same medicine that is in the shot we just gave you

## 2024-03-18 NOTE — ED Notes (Signed)
 Provided community resources

## 2024-03-18 NOTE — ED Triage Notes (Signed)
 Reports the tooth in the area of pain, broke off about a year ago.  Current episode of pain and swelling started Thursday night.  Pain is in right jaw, ear and neck  Patient has taken ibuprofen  800mg   Took amoxicillin  from a previous tooth ache issue.  Started this Thursday night , has had 3 of these tablets at this point.  Last antibiotic was 8 this morning

## 2024-03-18 NOTE — ED Notes (Signed)
 Provided a warm blanket to face to help pain
